# Patient Record
Sex: Female | Born: 1950 | Race: White | Hispanic: No | Marital: Married | Smoking: Never smoker
Health system: Southern US, Community
[De-identification: ages and names within clinical notes are randomized; demographics above are authoritative.]

## PROBLEM LIST (undated history)

## (undated) DIAGNOSIS — E119 Type 2 diabetes mellitus without complications: Secondary | ICD-10-CM

## (undated) DIAGNOSIS — C801 Malignant (primary) neoplasm, unspecified: Secondary | ICD-10-CM

## (undated) DIAGNOSIS — Z860101 Personal history of adenomatous and serrated colon polyps: Secondary | ICD-10-CM

## (undated) DIAGNOSIS — I10 Essential (primary) hypertension: Secondary | ICD-10-CM

## (undated) DIAGNOSIS — E785 Hyperlipidemia, unspecified: Secondary | ICD-10-CM

## (undated) DIAGNOSIS — K219 Gastro-esophageal reflux disease without esophagitis: Secondary | ICD-10-CM

## (undated) DIAGNOSIS — T7840XA Allergy, unspecified, initial encounter: Secondary | ICD-10-CM

## (undated) HISTORY — PX: SKIN CANCER EXCISION: SHX779

## (undated) HISTORY — PX: ESOPHAGOGASTRODUODENOSCOPY: SHX1529

## (undated) HISTORY — DX: Essential (primary) hypertension: I10

## (undated) HISTORY — PX: VAGINAL HYSTERECTOMY: SUR661

## (undated) HISTORY — PX: COLONOSCOPY: SHX174

## (undated) HISTORY — DX: Allergy, unspecified, initial encounter: T78.40XA

## (undated) HISTORY — PX: TUBAL LIGATION: SHX77

## (undated) HISTORY — DX: Type 2 diabetes mellitus without complications: E11.9

## (undated) HISTORY — DX: Gastro-esophageal reflux disease without esophagitis: K21.9

## (undated) HISTORY — DX: Hyperlipidemia, unspecified: E78.5

## (undated) HISTORY — DX: Malignant (primary) neoplasm, unspecified: C80.1

---

## 2006-06-19 ENCOUNTER — Ambulatory Visit (HOSPITAL_COMMUNITY): Admission: RE | Admit: 2006-06-19 | Discharge: 2006-06-20 | Payer: Self-pay | Admitting: Cardiovascular Disease

## 2007-03-29 ENCOUNTER — Ambulatory Visit: Payer: Self-pay | Admitting: Gastroenterology

## 2007-03-29 ENCOUNTER — Encounter: Payer: Self-pay | Admitting: Gastroenterology

## 2008-10-15 ENCOUNTER — Ambulatory Visit: Payer: Self-pay | Admitting: Family Medicine

## 2008-10-15 ENCOUNTER — Emergency Department: Payer: Self-pay | Admitting: Emergency Medicine

## 2008-11-17 ENCOUNTER — Ambulatory Visit: Payer: Self-pay | Admitting: Gastroenterology

## 2008-11-17 DIAGNOSIS — R109 Unspecified abdominal pain: Secondary | ICD-10-CM | POA: Insufficient documentation

## 2008-11-17 DIAGNOSIS — Z860100 Personal history of colon polyps, unspecified: Secondary | ICD-10-CM | POA: Insufficient documentation

## 2008-11-18 ENCOUNTER — Encounter: Payer: Self-pay | Admitting: Gastroenterology

## 2008-11-30 ENCOUNTER — Telehealth (INDEPENDENT_AMBULATORY_CARE_PROVIDER_SITE_OTHER): Payer: Self-pay | Admitting: *Deleted

## 2009-02-15 ENCOUNTER — Telehealth: Payer: Self-pay | Admitting: Gastroenterology

## 2009-02-16 ENCOUNTER — Ambulatory Visit: Payer: Self-pay | Admitting: Gastroenterology

## 2009-02-16 DIAGNOSIS — R933 Abnormal findings on diagnostic imaging of other parts of digestive tract: Secondary | ICD-10-CM | POA: Insufficient documentation

## 2009-02-16 LAB — CONVERTED CEMR LAB
AST: 23 units/L (ref 0–37)
Basophils Absolute: 0 10*3/uL (ref 0.0–0.1)
Bilirubin, Direct: 0.1 mg/dL (ref 0.0–0.3)
CO2: 29 meq/L (ref 19–32)
Creatinine, Ser: 0.8 mg/dL (ref 0.4–1.2)
GFR calc non Af Amer: 78.59 mL/min (ref >60)
Glucose, Bld: 189 mg/dL — ABNORMAL HIGH (ref 70–99)
HCT: 40 % (ref 36.0–46.0)
Lymphocytes Relative: 24.7 % (ref 12.0–46.0)
Monocytes Absolute: 0.3 10*3/uL (ref 0.1–1.0)
Neutro Abs: 4 10*3/uL (ref 1.4–7.7)
Potassium: 3.9 meq/L (ref 3.5–5.1)
RDW: 11.7 % (ref 11.5–14.6)
Sodium: 142 meq/L (ref 135–145)
WBC: 5.9 10*3/uL (ref 4.5–10.5)

## 2009-02-23 ENCOUNTER — Ambulatory Visit (HOSPITAL_COMMUNITY): Admission: RE | Admit: 2009-02-23 | Discharge: 2009-02-23 | Payer: Self-pay | Admitting: Gastroenterology

## 2009-03-18 ENCOUNTER — Telehealth: Payer: Self-pay | Admitting: Gastroenterology

## 2009-03-19 ENCOUNTER — Ambulatory Visit: Payer: Self-pay | Admitting: Gastroenterology

## 2009-04-05 ENCOUNTER — Ambulatory Visit: Payer: Self-pay | Admitting: Gastroenterology

## 2009-06-14 ENCOUNTER — Telehealth: Payer: Self-pay | Admitting: Gastroenterology

## 2009-07-07 ENCOUNTER — Ambulatory Visit: Payer: Self-pay | Admitting: Gastroenterology

## 2009-09-01 ENCOUNTER — Ambulatory Visit: Payer: Self-pay | Admitting: Family Medicine

## 2010-06-14 NOTE — Progress Notes (Signed)
Summary: diarrhea  Phone Note Call from Patient Call back at Home Phone [phone]   Caller: Patient Call For: Dr. Christella Hartigan Reason for Call: Talk to Nurse Summary of Call: pt reports she ia having diarrhea again and thinks she may need to go on abx Initial call taken by: Vallarie Mare,  June 14, 2009 1:02 PM  Follow-up for Phone Call        pt states she is having diarrhea  again she took immodium wich helped but it is happening more often.  She said Dr Christella Hartigan told her she may need to take flagyl again if the diarrhea returned.  SHould she do this. Follow-up by: Chales Abrahams CMA Duncan Dull),  June 14, 2009 2:16 PM  Additional Follow-up for Phone Call Additional follow up Details #1::        yes, I've suspected bacterial overgrowth in the past.  please call in flagyl 250mg , three times a day, 14 days.  have her schedule rov with me for 4-5 weeks, sooner if needed Additional Follow-up by: Rachael Fee MD,  June 14, 2009 2:25 PM    New/Updated Medications: FLAGYL 250 MG TABS (METRONIDAZOLE) 1 by mouth three times a day x 14 days Prescriptions: FLAGYL 250 MG TABS (METRONIDAZOLE) 1 by mouth three times a day x 14 days  #42 x 0   Entered by:   Chales Abrahams CMA (AAMA)   Authorized by:   Rachael Fee MD   Signed by:   Chales Abrahams CMA (AAMA) on 06/14/2009   Method used:   [facility] to        [id number]       [address]       [place], [place]  [address]       Ph: [phone] or [phone]       Fax: [phone]   RxID:   [id number]

## 2010-06-14 NOTE — Assessment & Plan Note (Signed)
Review of gastrointestinal problems: 1. Adenomatous polyps, outside provider. Last colonoscopy November 2008 found no adenomatous polyps. Recall colonoscopy November, 2013. 2. Acute abd pains, resolved (june 2010):  [place] [facility] [place], labs, then CT scan. her liver tests were completely normal, she did have a slightly elevated white blood cell count at 14,000, her lipase was normal. The ultrasound was normal except for common bile duct of 7.7 mm. Gallbladder is reported to be normal, no stones. CT Scan was essentially normal except again slightly prominent CBD.  3. intermittent diarrhea, vomiting episodes.  Possible small bowel bacterial overgrowth symptoms.  Concern for possible biliary pathology led to October, 2010 MRCP shows no CBD pathology ( nondilated, no stones) + duodenal diverticulum.  Her symptoms responded very quickly to Empiric Metronidazole for 10 days. February, 2011: symptoms again responded to Flagyl t.i.d. for 2 weeks . ??Medication related to Janumet ( #1 side effect is diarrhea)   History of Present Illness Visit Type: Follow-up Visit Primary GI MD: Rob Bunting MD Primary Provider: Gerlene Burdock Chief Complaint: follow-up pt. still c/o diarrhea has improved since taking antibiotic History of Present Illness:     very pleasant 60 year old woman whom I last saw several months ago.  who has been having intermittent loose stools for months.  A single immodium will cause constipation for days.  This was worse several weeks ago (waking her up at night, fecal incontinence).  She took 2 weeks of flagyl and and has been off for one week and has had ZERO episodes of the loose stools. She has had regular BMs for 10 days (off the flagyl).             Current Medications (verified): 1)  Zyrtec Allergy 10 Mg Tabs (Cetirizine Hcl) .Marland Kitchen.. 1 By Mouth Once Daily 2)  Cartia Xt 120 Mg Xr24h-Cap (Diltiazem Hcl Coated Beads) .Marland Kitchen.. 1 By Mouth Once Daily 3)  Aspir-Low 81 Mg Tbec  (Aspirin) .Marland Kitchen.. 1 By Mouth Once Daily 4)  Janumet 50-1000 Mg Tabs (Sitagliptin-Metformin Hcl) .Marland Kitchen.. 1 By Mouth Two Times A Day 5)  Avapro 150 Mg Tabs (Irbesartan) .Marland Kitchen.. 1 By Mouth Once Daily 6)  Pravachol 40 Mg Tabs (Pravastatin Sodium) .Marland Kitchen.. 1 By Mouth Once Daily 7)  Nexium 40 Mg Cpdr (Esomeprazole Magnesium) .Marland Kitchen.. 1 By Mouth Once Daily  Allergies (verified): 1)  ! Sudafed  Vital Signs:  Patient profile:   60 year old female Height:      69 inches Weight:      178 pounds BMI:     26.38 Pulse rate:   72 / minute Pulse rhythm:   regular BP sitting:   140 / 68  (left arm)  Vitals Entered By: Milford Cage NCMA (July 07, 2009 8:49 AM)  Physical Exam  Additional Exam:  Constitutional: generally well appearing Psychiatric: alert and oriented times 3 Abdomen: soft, non-tender, non-distended, normal bowel sounds    Impression & Recommendations:  Problem # 1:  intermittent loose stools her symptoms clearly respond to Flagyl. This suggests some type of bacterial process. She has diabetes and small bowel bacterial overgrowth is not uncommon and certainly could be playing a role. For now we will just observe how she does clinically. If she returns to having intermittent mild diarrhea she will start Imodium one half pill to one pill every day. If this is not helpful she will call and will start her Flagyl again. Janumet #1 side effect is diarrhea, her dose has not been changed in 2 years however it may  be contributing to her intermittent, chronic situation.  Patient Instructions: 1)  Prescription for flagyl given 250mg  pills, take one pill three times a day as needed. 2)  If you start to have mild, intermittent loose stools, please begin 1/2 to one full immodium pill every AM after waking up.  Only stop if no BM in 2-3 days. 3)  If this isn't helpful, begin the flagyl (above) and call Dr. Christella Hartigan office. 4)  A copy of this information will be sent to Dr. Thermon Leyland. 5)  The medication list was  reviewed and reconciled.  All changed / newly prescribed medications were explained.  A complete medication list was provided to the patient / caregiver. Prescriptions: METRONIDAZOLE 250 MG  TABS (METRONIDAZOLE) Take 1pill three times a day for 2 weeks  #42 x 2   Entered and Authorized by:   Rachael Fee MD   Signed by:   Rachael Fee MD on 07/07/2009   Method used:   [facility]        [id number]       [address]       [place], [place]  [address]       Ph: [phone] or [phone]       Fax: [phone]   RxID:   [id number]

## 2010-09-30 NOTE — Cardiovascular Report (Signed)
NAMESHANTAY, SONN             ACCOUNT NO.:  [id number]   MEDICAL RECORD NO.:  [id number]          PATIENT TYPE:  OIB   LOCATION:  2899                         FACILITY:  [facility]   PHYSICIAN:  Darlin Priestly, MD  DATE OF BIRTH:  11-Jun-1950   DATE OF PROCEDURE:  06/19/2006  DATE OF DISCHARGE:                            CARDIAC CATHETERIZATION   PROCEDURE:  A heads-up tilt-table testing with Isuprel infusion.   COMPLICATIONS:  None.   INDICATIONS:  Ms. Lewison is a 60 year old female patient of Dr.  Charlette Caffey in [place] as well as Dr. Nanetta Batty with a history of  recurrent dizziness and presyncope happening approximately once a week.  She has undergone normal Cardiolite scan ruling out ischemia as well as  a normal echocardiogram.  She is now referred for tilt-table testing to  rule out possible neurocardiogenic syncope.   DESCRIPTION:  After obtaining informed consent, the patient was brought  to the cardiac catheterization lab in a fasting state.  She then placed  in supine position where hemodynamics were obtained.  Resting blood  pressure 140/70, respiratory rate 70.  She was then monitored for  approximately 5 minutes and then tilted to a 70-degree heads-up  position.  This was maintained for 30 minutes with no significant change  in heart rate or blood pressure.  She was then returned to supine  position.  Isuprel infusion was begun at 0.5 mcg per minute.  This was  ultimately up titrated to 1 mcg per minute where she was maintained for  approximately 20 minutes.  She remained hemodynamically stable with a  maximum heart rate of 112 and no change or blood pressure or heart rate.  She was then returned back to the supine position.  She was then  transferred to the recovery room in stable condition.   CONCLUSION:  Negative heads-up tilt-table testing with Isuprel infusion.      Darlin Priestly, MD  Electronically Signed     RHM/MEDQ  D:  06/19/2006  T:   06/19/2006  Job:  [id number]   cc:   Nanetta Batty, M.D.  [employer]

## 2011-01-06 ENCOUNTER — Ambulatory Visit: Payer: Self-pay | Admitting: Family Medicine

## 2012-01-09 ENCOUNTER — Ambulatory Visit: Payer: Self-pay | Admitting: Family Medicine

## 2012-04-16 ENCOUNTER — Telehealth: Payer: Self-pay | Admitting: Gastroenterology

## 2012-04-16 NOTE — Telephone Encounter (Signed)
Pt is due for procedure 03/2012 Dr Christella Hartigan will review and I will call pt to schedule

## 2012-04-19 ENCOUNTER — Encounter: Payer: Self-pay | Admitting: Gastroenterology

## 2012-06-18 ENCOUNTER — Encounter: Payer: Self-pay | Admitting: Gastroenterology

## 2012-07-23 ENCOUNTER — Encounter: Payer: Self-pay | Admitting: Gastroenterology

## 2012-07-23 ENCOUNTER — Ambulatory Visit (AMBULATORY_SURGERY_CENTER): Payer: 59 | Admitting: *Deleted

## 2012-07-23 VITALS — Ht 69.0 in | Wt 186.6 lb

## 2012-07-23 DIAGNOSIS — Z86010 Personal history of colonic polyps: Secondary | ICD-10-CM

## 2012-07-23 DIAGNOSIS — Z1211 Encounter for screening for malignant neoplasm of colon: Secondary | ICD-10-CM

## 2012-07-23 MED ORDER — NA SULFATE-K SULFATE-MG SULF 17.5-3.13-1.6 GM/177ML PO SOLN: ORAL | Status: DC

## 2012-07-29 ENCOUNTER — Encounter: Payer: Self-pay | Admitting: Gastroenterology

## 2012-07-29 ENCOUNTER — Ambulatory Visit (AMBULATORY_SURGERY_CENTER): Payer: 59 | Admitting: Gastroenterology

## 2012-07-29 ENCOUNTER — Other Ambulatory Visit: Payer: Self-pay | Admitting: Gastroenterology

## 2012-07-29 VITALS — BP 116/67 | HR 75 | Temp 96.9°F | Resp 14 | Ht 69.0 in | Wt 186.0 lb

## 2012-07-29 DIAGNOSIS — Z86010 Personal history of colonic polyps: Secondary | ICD-10-CM

## 2012-07-29 DIAGNOSIS — Z800 Family history of malignant neoplasm of digestive organs: Secondary | ICD-10-CM

## 2012-07-29 DIAGNOSIS — Z1211 Encounter for screening for malignant neoplasm of colon: Secondary | ICD-10-CM

## 2012-07-29 DIAGNOSIS — K5730 Diverticulosis of large intestine without perforation or abscess without bleeding: Secondary | ICD-10-CM

## 2012-07-29 MED ORDER — SODIUM CHLORIDE 0.9 % IV SOLN: 500.0000 mL | INTRAVENOUS | Status: DC

## 2012-07-29 NOTE — Progress Notes (Signed)
Pressure applied to abdomen to reach cecum.  

## 2012-07-29 NOTE — Op Note (Signed)
[facility] [address] [place], [address]   COLONOSCOPY PROCEDURE REPORT  PATIENT: Angelica Murray, Angelica Murray  MR#: [id number] BIRTHDATE: 10/26/1950 , 61  yrs. old GENDER: Female ENDOSCOPIST: Rachael Fee, MD PROCEDURE DATE:  07/29/2012 PROCEDURE:   Colonoscopy, surveillance ASA CLASS:   Class II INDICATIONS:Adenomatous polyps, outside provider.  Last colonoscopy November 2008 found no adenomatous polyps.  Also several second degree relatives with colon cancer MEDICATIONS: Fentanyl 75 mcg IV, Versed 8 mg IV, and These medications were titrated to patient response per physician's verbal order  DESCRIPTION OF PROCEDURE:   After the risks benefits and alternatives of the procedure were thoroughly explained, informed consent was obtained.  A digital rectal exam revealed no abnormalities of the rectum.   The LB [id number]  endoscope was introduced through the anus and advanced to the cecum, which was identified by both the appendix and ileocecal valve. No adverse events experienced.   The quality of the prep was good.  The instrument was then slowly withdrawn as the colon was fully examined.   COLON FINDINGS: There were a few small diverticulum in left colon. The examination was otherwise normal.  Retroflexed views revealed no abnormalities. The time to cecum=3 minutes 20 seconds. Withdrawal time=6 minutes 08 seconds.  The scope was withdrawn and the procedure completed. COMPLICATIONS: There were no complications.  ENDOSCOPIC IMPRESSION: There were a few small diverticulum in left colon. The examination was otherwise normal. No polyps or cancers  RECOMMENDATIONS: Given your significant family history of colon cancer (several second degree relatives) and personal history of adenomatous polyps you should have a repeat colonoscopy in 5 years   eSigned:  Rachael Fee, MD 07/29/2012 11:02 AM   cc: Dennison Mascot, MD

## 2012-07-29 NOTE — Patient Instructions (Addendum)
YOU HAD AN ENDOSCOPIC PROCEDURE TODAY AT THE [facility]: Refer to the procedure report that was given to you for any specific questions about what was found during the examination.  If the procedure report does not answer your questions, please call your gastroenterologist to clarify.  If you requested that your care partner not be given the details of your procedure findings, then the procedure report has been included in a sealed envelope for you to review at your convenience later.  YOU SHOULD EXPECT: Some feelings of bloating in the abdomen. Passage of more gas than usual.  Walking can help get rid of the air that was put into your GI tract during the procedure and reduce the bloating. If you had a lower endoscopy (such as a colonoscopy or flexible sigmoidoscopy) you may notice spotting of blood in your stool or on the toilet paper. If you underwent a bowel prep for your procedure, then you may not have a normal bowel movement for a few days.  DIET: Your first meal following the procedure should be a light meal and then it is ok to progress to your normal diet.  A half-sandwich or bowl of soup is an example of a good first meal.  Heavy or fried foods are harder to digest and may make you feel nauseous or bloated.  Likewise meals heavy in dairy and vegetables can cause extra gas to form and this can also increase the bloating.  Drink plenty of fluids but you should avoid alcoholic beverages for 24 hours.  ACTIVITY: Your care partner should take you home directly after the procedure.  You should plan to take it easy, moving slowly for the rest of the day.  You can resume normal activity the day after the procedure however you should NOT DRIVE or use heavy machinery for 24 hours (because of the sedation medicines used during the test).    SYMPTOMS TO REPORT IMMEDIATELY: A gastroenterologist can be reached at any hour.  During normal business hours, 8:30 AM to 5:00 PM Monday through Friday,  call [phone].  After hours and on weekends, please call the GI answering service at [phone] who will take a message and have the physician on call contact you.   Following lower endoscopy (colonoscopy or flexible sigmoidoscopy):  Excessive amounts of blood in the stool  Significant tenderness or worsening of abdominal pains  Swelling of the abdomen that is new, acute  Fever of 100F or higher  Following upper endoscopy (EGD)  Vomiting of blood or coffee ground material  New chest pain or pain under the shoulder blades  Painful or persistently difficult swallowing  New shortness of breath  Fever of 100F or higher  Black, tarry-looking stools  FOLLOW UP: If any biopsies were taken you will be contacted by phone or by letter within the next 1-3 weeks.  Call your gastroenterologist if you have not heard about the biopsies in 3 weeks.  Our staff will call the home number listed on your records the next business day following your procedure to check on you and address any questions or concerns that you may have at that time regarding the information given to you following your procedure. This is a courtesy call and so if there is no answer at the home number and we have not heard from you through the emergency physician on call, we will assume that you have returned to your regular daily activities without incident.  SIGNATURES/CONFIDENTIALITY: You and/or your care   partner have signed paperwork which will be entered into your electronic medical record.  These signatures attest to the fact that that the information above on your After Visit Summary has been reviewed and is understood.  Full responsibility of the confidentiality of this discharge information lies with you and/or your care-partner.  

## 2012-07-29 NOTE — Progress Notes (Signed)
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  This procedure was done 07-16-12.  Pt denies any pain. Maw

## 2012-07-29 NOTE — Progress Notes (Signed)
Patient did not experience any of the following events: a burn prior to discharge; a fall within the facility; wrong site/side/patient/procedure/implant event; or a hospital transfer or hospital admission upon discharge from the facility. (G8907) Patient did not have preoperative order for IV antibiotic SSI prophylaxis. (G8918)  

## 2012-07-30 ENCOUNTER — Telehealth: Payer: Self-pay | Admitting: *Deleted

## 2012-07-30 NOTE — Telephone Encounter (Signed)
  Follow up Call-  Call back number 07/29/2012  Post procedure Call Back phone  # [phone] hm  Permission to leave phone message Yes     Patient questions:  Do you have a fever, pain , or abdominal swelling? no Pain Score  0 *  Have you tolerated food without any problems? yes  Have you been able to return to your normal activities? yes  Do you have any questions about your discharge instructions: Diet   no Medications  no Follow up visit  no  Do you have questions or concerns about your Care? no  Actions: * If pain score is 4 or above: No action needed, pain <4.  Pt. Stated I still the effects of sedation, still fill sleepy. Instructed pt. Just take it easy and slow today that sedation hang around in system a little longer she verbalize understanding.

## 2012-12-30 ENCOUNTER — Telehealth: Payer: Self-pay | Admitting: Gastroenterology

## 2012-12-30 NOTE — Telephone Encounter (Signed)
Pt complains of severe diarrhea "feels like it did when I had a bacterial overgrowth"  Added to Doug Sou appt for 01/01/13

## 2013-01-01 ENCOUNTER — Encounter: Payer: Self-pay | Admitting: Gastroenterology

## 2013-01-01 ENCOUNTER — Ambulatory Visit (INDEPENDENT_AMBULATORY_CARE_PROVIDER_SITE_OTHER): Payer: 59 | Admitting: Gastroenterology

## 2013-01-01 VITALS — BP 118/61 | HR 74 | Ht 69.0 in | Wt 182.2 lb

## 2013-01-01 DIAGNOSIS — R197 Diarrhea, unspecified: Secondary | ICD-10-CM | POA: Insufficient documentation

## 2013-01-01 MED ORDER — METRONIDAZOLE 250 MG PO TABS: 250.0000 mg | ORAL_TABLET | Freq: Three times a day (TID) | ORAL | Status: AC

## 2013-01-01 NOTE — Patient Instructions (Addendum)
We have given you lab orders to take to [facility]. We sent a prescription for Flagyl ( Metronidazole ) to [facility] [place] [place]. We made you an appointment to follow up with Dr. Christella Hartigan on 02-05-2013 at 2:15 PM.

## 2013-01-01 NOTE — Progress Notes (Signed)
01/01/2013 Angelica Murray [id number] 1951-01-22   History of Present Illness:  This is a 62 year old female who is known to Dr. Christella Hartigan.  She has seen him in the past for complaints of intermittent diarrhea with urgency and incontinence.  Has been evaluated with U/S, CT scan, MRI/MRCP, colonoscopy, and stool studies during episodes of this diarrhea, which were all unrevealing.  She has tried probiotics in the past, but they did not seem to help.  She did respond to a course of flagyl, however.    She presents to our office today with the same symptoms of diarrhea 4-6 times a day.  It is very watery and has a lot of urgency with it.  She has been wearing Depends because she has episodes of incontinence.  Does not seem to be worse with certain foods.  She is on Janumet and has been on that for several years with no recent change in dose.  No recent antibiotic use or travel.  Colonoscopy 07/2012 was normal except for a few small diverticulum in the left colon.   Current Medications, Allergies, Past Medical History, Past Surgical History, Family History and Social History were reviewed in [employer] record.   Physical Exam: BP 118/61  Pulse 74  Ht 5\' 9"  (1.753 m)  Wt 182 lb 3.2 oz (82.645 kg)  BMI 26.89 kg/m2  SpO2 98% General: Well developed, white female in no acute distress Head: Normocephalic and atraumatic Eyes:  sclerae anicteric, conjunctiva pink  Ears: Normal auditory acuity Lungs: Clear throughout to auscultation Heart: Regular rate and rhythm Abdomen: Soft, non-tender and non-distended. No masses, no hepatomegaly.  Slightly hyperactive bowel sounds. Musculoskeletal: Symmetrical with no gross deformities  Extremities: No edema  Neurological: Alert oriented x 4, grossly nonfocal Psychological:  Alert and cooperative. Normal mood and affect  Assessment and Recommendations: -Diarrhea:  Similar to previous episodes but now with much more urgency and  incontinence at times.  Responded previously to a course of flagyl.  ? SIBO vs IBS.  Also, she is on Janumet, which can definitely cause diarrhea and may be contributing to the situation.  Will check TSH and celiac labs.  Repeat a course of flagyl, 250 mg TID x 14 days.  Follow up in 4-6 weeks.

## 2013-01-01 NOTE — Progress Notes (Signed)
i agree with this plan 

## 2013-02-05 ENCOUNTER — Encounter: Payer: Self-pay | Admitting: Gastroenterology

## 2013-02-05 ENCOUNTER — Ambulatory Visit (INDEPENDENT_AMBULATORY_CARE_PROVIDER_SITE_OTHER): Payer: 59 | Admitting: Gastroenterology

## 2013-02-05 VITALS — BP 130/78 | HR 60 | Ht 69.0 in | Wt 180.0 lb

## 2013-02-05 DIAGNOSIS — R1013 Epigastric pain: Secondary | ICD-10-CM

## 2013-02-05 DIAGNOSIS — R112 Nausea with vomiting, unspecified: Secondary | ICD-10-CM

## 2013-02-05 DIAGNOSIS — G8929 Other chronic pain: Secondary | ICD-10-CM

## 2013-02-05 MED ORDER — CHOLESTYRAMINE 4 GM/DOSE PO POWD: 4.0000 g | Freq: Every day | ORAL | Status: DC

## 2013-02-05 NOTE — Patient Instructions (Addendum)
You will be set up for a CT scan of abdomen and pelvis with IV and oral contrast for epigastric pain, vomiting.  You have been scheduled for a CT scan of the abdomen and pelvis at [facility] ([address]---this is in the same building as [facility]).   You are scheduled on 02/11/13 at 1 pm. You should arrive 15 minutes prior to your appointment time for registration. Please follow the written instructions below on the day of your exam:  WARNING: IF YOU ARE ALLERGIC TO IODINE/X-RAY DYE, PLEASE NOTIFY RADIOLOGY IMMEDIATELY AT [phone]! YOU WILL BE GIVEN A 13 HOUR PREMEDICATION PREP.  1) Do not eat or drink anything after 9 am (4 hours prior to your test) 2) You have been given 2 bottles of oral contrast to drink. The solution may taste better if refrigerated, but do NOT add ice or any other liquid to this solution. Shake well before drinking.    Drink 1 bottle of contrast @ 11 am (2 hours prior to your exam)  Drink 1 bottle of contrast @ 12 noon (1 hour prior to your exam)  You may take any medications as prescribed with a small amount of water except for the following: Metformin, Glucophage, Glucovance, Avandamet, Riomet, Fortamet, Actoplus Met, Janumet, Glumetza or Metaglip. The above medications must be held the day of the exam AND 48 hours after the exam.  The purpose of you drinking the oral contrast is to aid in the visualization of your intestinal tract. The contrast solution may cause some diarrhea. Before your exam is started, you will be given a small amount of fluid to drink. Depending on your individual set of symptoms, you may also receive an intravenous injection of x-ray contrast/dye. Plan on being at [facility] for 30 minutes or long, depending on the type of exam you are having performed.  This test typically takes 30-45 minutes to complete.  If you have any questions regarding your exam or if you need to reschedule, you may call the CT department  at [phone] between the hours of 8:00 am and 5:00 pm, Monday-Friday.  ________________________________________________________________________  Send a copy of your cmet to [place] when it is available.. Please start once daily cholestryamine 4gm powder, once daily.

## 2013-02-05 NOTE — Progress Notes (Signed)
Review of gastrointestinal problems:  1. Adenomatous polyps, outside provider. Last colonoscopy November 2008 found no adenomatous polyps. Repeat colonoscopy 07/2012 found diverticulosis, no polyps.  REcall at 5 years (many second degree relatives with CRC) 2. Acute abd pains, resolved (june 2010): [facility] [place], labs, then CT scan. her liver tests were completely normal, she did have a slightly elevated white blood cell count at 14,000, her lipase was normal. The ultrasound was normal except for common bile duct of 7.7 mm. Gallbladder is reported to be normal, no stones. CT Scan was essentially normal except again slightly prominent CBD.  3. intermittent diarrhea, vomiting episodes. Possible small bowel bacterial overgrowth symptoms. Concern for possible biliary pathology led to October, 2010 MRCP shows no CBD pathology ( nondilated, no stones) + duodenal diverticulum. Her symptoms responded very quickly to Empiric Metronidazole for 10 days. February, 2011: symptoms again responded to Flagyl t.i.d. for 2 weeks . ??Medication related to Janumet ( #1 side effect is diarrhea). 12/2012 another episode, given repeat course of flagyl for 2 weeks, ordered labs but she didn't get them done.   HPI: This is a   very pleasant 62 year old woman whom I last saw personally to 3 years ago. She was here in the office a month ago with diarrhea. This is felt to be perhaps her same diarrhea episodes that she had in the past but improve with him. Metronidazole. Presumed possible small bowel bacterial overgrowth. She had celiac panel testing and thyroid testing done at [facility] she tells me that those were all normal. Her diarrhea has improved a bit but she still having several loose stools a day. She is also very bothered by ileus nausea and at times bilious emesis. She has some epigastric abdominal pain.  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  Diarrhea is  bit better since the flagyl.  She will go 6 times per day. Usually loose, can be formed at times though.  No fevers or chills.  She has been vomiting a lot, taste like bile to her.  Not daily.     Past Medical History  Diagnosis Date  . Allergy   . Diabetes mellitus without complication   . GERD (gastroesophageal reflux disease)   . Hyperlipidemia   . Hypertension   . Cancer     skin CA- squamous cell- leg; basal cell- face    Past Surgical History  Procedure Laterality Date  . Skin cancer excision    . Colonoscopy    . Vaginal hysterectomy    . Tubal ligation      Current Outpatient Prescriptions  Medication Sig Dispense Refill  . aspirin 81 MG tablet Take 81 mg by mouth daily.      Marland Kitchen diltiazem (CARDIZEM CD) 120 MG 24 hr capsule Take 120 mg by mouth daily.      Marland Kitchen esomeprazole (NEXIUM) 40 MG capsule Take 40 mg by mouth daily before breakfast.      . irbesartan (AVAPRO) 150 MG tablet Take 150 mg by mouth at bedtime.      Marland Kitchen losartan (COZAAR) 100 MG tablet       . pravastatin (PRAVACHOL) 40 MG tablet Take 40 mg by mouth daily.      . sitaGLIPtan-metformin (JANUMET) 50-1000 MG per tablet Take 1 tablet by mouth 2 (two) times daily with a meal.       No current facility-administered medications for this visit.    Allergies as of  02/05/2013 - Review Complete 02/05/2013  Allergen Reaction Noted  . Morphine and related Nausea And Vomiting 07/23/2012  . Pseudoephedrine Hives 11/09/2008    Family History  Problem Relation Age of Onset  . Colon polyps Sister   . Colon cancer Maternal Uncle     6 maternal uncles had colon/rectal CA  . Rectal cancer Maternal Uncle   . Colon cancer Maternal Uncle   . Colon cancer Maternal Uncle   . Colon cancer Maternal Uncle   . Colon cancer Maternal Uncle   . Colon cancer Maternal Uncle   . Colon polyps Sister   . Prostate cancer Father   . Esophageal cancer Neg Hx   . Stomach cancer Neg Hx     History   Social History  . Marital  Status: Married    Spouse Name: N/A    Number of Children: N/A  . Years of Education: N/A   Occupational History  . Not on file.   Social History Main Topics  . Smoking status: Never Smoker   . Smokeless tobacco: Never Used  . Alcohol Use: No  . Drug Use: No  . Sexual Activity: Not on file   Other Topics Concern  . Not on file   Social History Narrative  . No narrative on file      Physical Exam: BP 130/78  Pulse 60  Ht 5\' 9"  (1.753 m)  Wt 180 lb (81.647 kg)  BMI 26.57 kg/m2 Constitutional: generally well-appearing Psychiatric: alert and oriented x3 Abdomen: soft, slightly tender in mid epigastrium, nondistended, no obvious ascites, no peritoneal signs, normal bowel sounds     Assessment and plan: 62 y.o. female with recurrent episodes of several days, weeks of diarrhea.    this has been worked up in the past see above. she is a bit tender on examination in her epigastrium she has had no fevers or chills. I'm going to try her on cholestyramine powder to see if that will help her loose stools.  I am also going to have her get a CT scan abdomen and pelvis for epigastric pain.

## 2013-02-10 ENCOUNTER — Other Ambulatory Visit: Payer: Self-pay

## 2013-02-10 DIAGNOSIS — Z1389 Encounter for screening for other disorder: Secondary | ICD-10-CM

## 2013-02-11 ENCOUNTER — Ambulatory Visit (INDEPENDENT_AMBULATORY_CARE_PROVIDER_SITE_OTHER)
Admission: RE | Admit: 2013-02-11 | Discharge: 2013-02-11 | Disposition: A | Discharge status: Home / Self Care | Payer: 59 | Source: Ambulatory Visit | Attending: Gastroenterology | Admitting: Gastroenterology

## 2013-02-11 DIAGNOSIS — R112 Nausea with vomiting, unspecified: Secondary | ICD-10-CM

## 2013-02-11 DIAGNOSIS — G8929 Other chronic pain: Secondary | ICD-10-CM

## 2013-02-11 DIAGNOSIS — R1013 Epigastric pain: Secondary | ICD-10-CM

## 2013-02-11 MED ORDER — IOHEXOL 300 MG/ML  SOLN
100.0000 mL | Freq: Once | INTRAMUSCULAR | Status: AC | PRN
  Administered 2013-02-11: 100 mL via INTRAVENOUS

## 2013-02-12 ENCOUNTER — Telehealth: Payer: Self-pay | Admitting: Gastroenterology

## 2013-02-12 NOTE — Telephone Encounter (Signed)
Please call the patient. CT scan shows no clear cause of her symptoms, notable most for duodenal diverticulum (4cm). Should continue with the suggestions outlined at recent visit. She should call here in 4 weeks to report on her symptoms.

## 2013-02-12 NOTE — Telephone Encounter (Signed)
The patient has been notified of this information and all questions answered.

## 2013-10-10 ENCOUNTER — Ambulatory Visit: Payer: Self-pay | Admitting: Family Medicine

## 2014-07-03 DIAGNOSIS — I10 Essential (primary) hypertension: Secondary | ICD-10-CM

## 2014-07-03 DIAGNOSIS — E1129 Type 2 diabetes mellitus with other diabetic kidney complication: Secondary | ICD-10-CM | POA: Insufficient documentation

## 2014-07-03 DIAGNOSIS — E7800 Pure hypercholesterolemia, unspecified: Secondary | ICD-10-CM

## 2014-07-03 HISTORY — DX: Pure hypercholesterolemia, unspecified: E78.00

## 2014-07-03 HISTORY — DX: Type 2 diabetes mellitus with other diabetic kidney complication: E11.29

## 2014-07-03 HISTORY — DX: Essential (primary) hypertension: I10

## 2015-05-03 ENCOUNTER — Other Ambulatory Visit: Payer: Self-pay | Admitting: Internal Medicine

## 2015-05-03 DIAGNOSIS — Z1231 Encounter for screening mammogram for malignant neoplasm of breast: Secondary | ICD-10-CM

## 2015-05-12 ENCOUNTER — Ambulatory Visit: Payer: Self-pay

## 2015-05-22 IMAGING — CT CT ABD-PELV W/ CM
2 of 5 series · 16 of 46 positions shown, 18 images · IV contrast (Omnipaque 300)
Comparison: abdominal MR February 23, 2009

CLINICAL DATA: Epigastric pain with nausea, bilious vomiting, and
diarrhea

EXAM:
CT ABDOMEN AND PELVIS WITH CONTRAST
TECHNIQUE: Multidetector CT imaging of the abdomen and pelvis was performed
using the standard protocol following bolus administration of
intravenous contrast. Oral contrast was also administered.
CONTRAST:  100mL OMNIPAQUE IOHEXOL 300 MG/ML  SOLN

[Series 2: abd/ pel 5mm · axial · 0.69mm/px · z∈[-442,-17]mm · 13 of 95 slices shown, 15 images]
[im 5/95  soft-tissue]
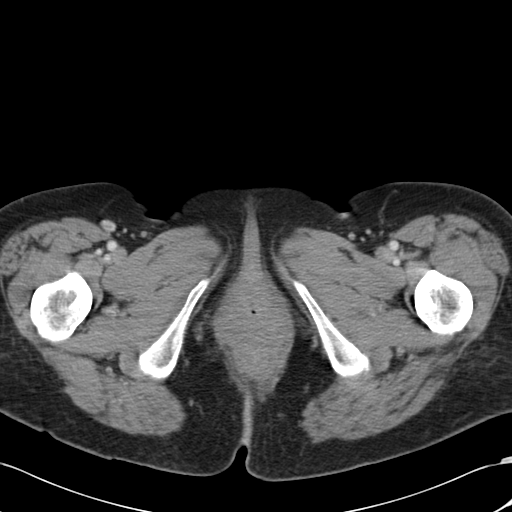
[im 5/95  bone]
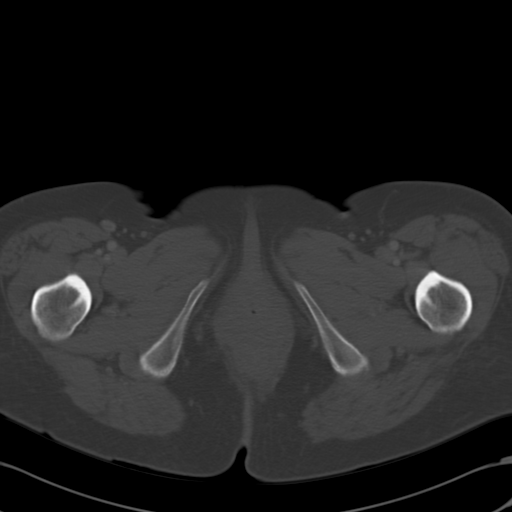
[im 15/95  soft-tissue]
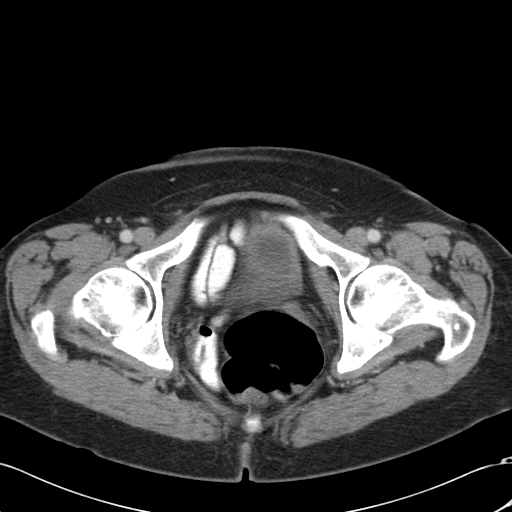
[im 19/95  soft-tissue]
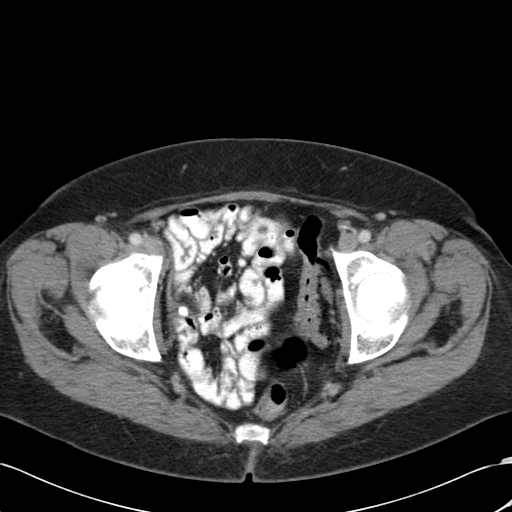
[im 29/95  soft-tissue]
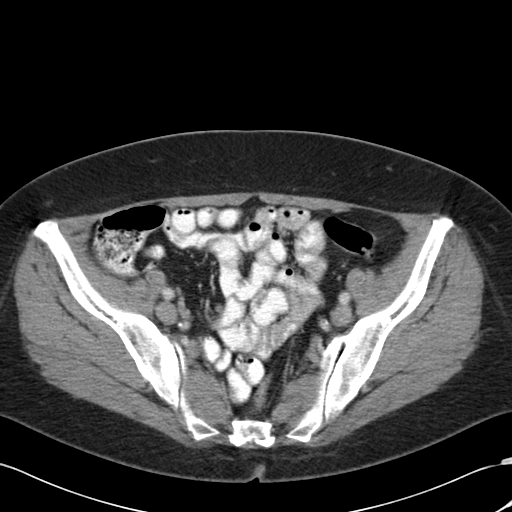
[im 33/95  soft-tissue]
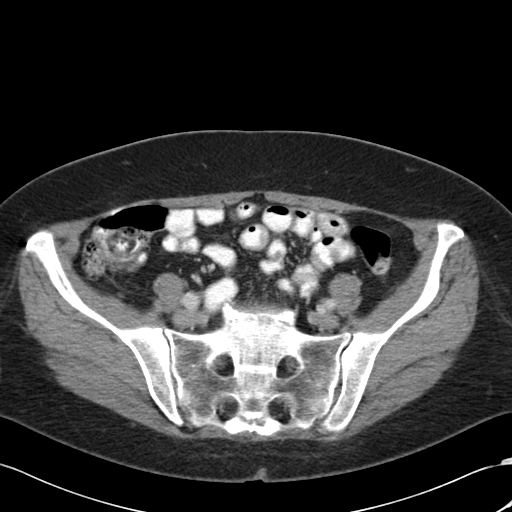
[im 43/95  soft-tissue]
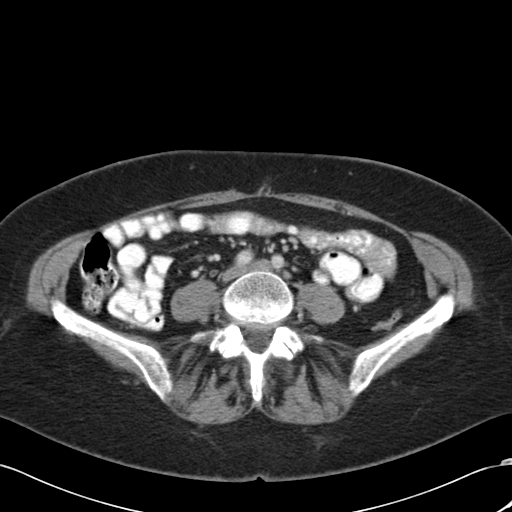
[im 48/95  soft-tissue]
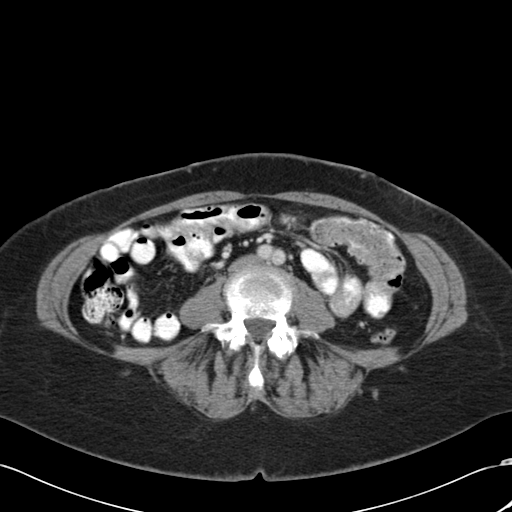
[im 52/95  soft-tissue]
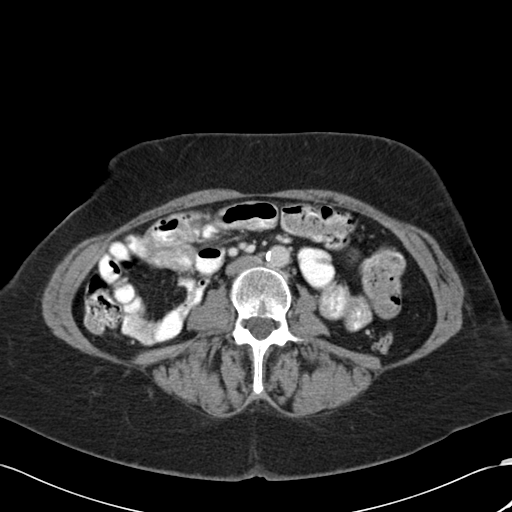
[im 62/95  soft-tissue]
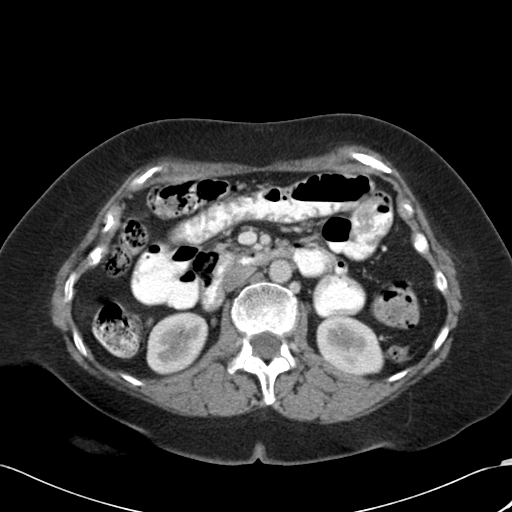
[im 62/95  bone]
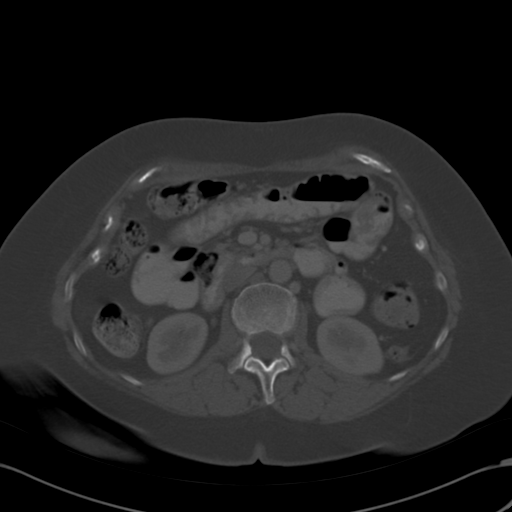
[im 66/95  soft-tissue]
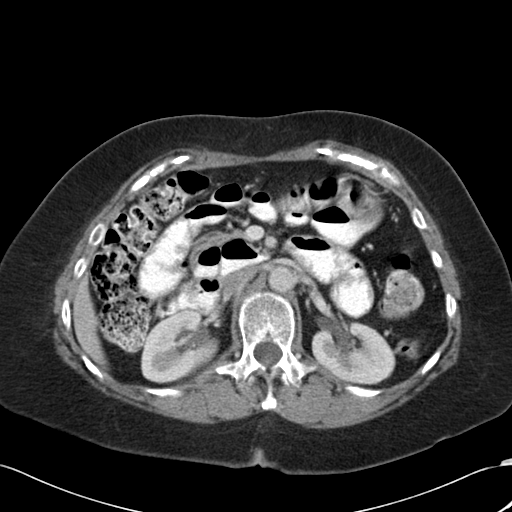
[im 76/95  soft-tissue]
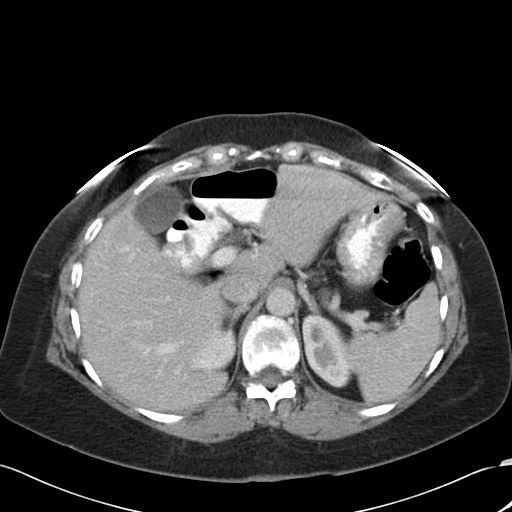
[im 80/95  soft-tissue]
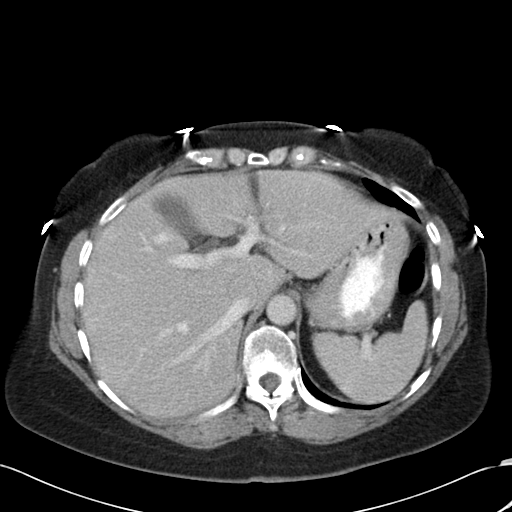
[im 90/95  soft-tissue]
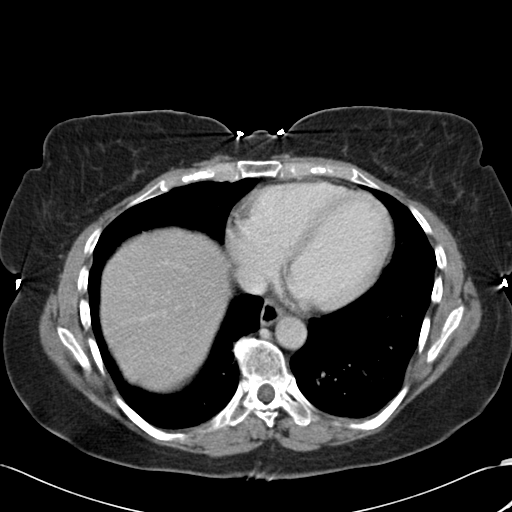

[Series 602: cor · coronal · 0.95mm/px · 3 of 101 slices shown]
[im 34/101  soft-tissue]
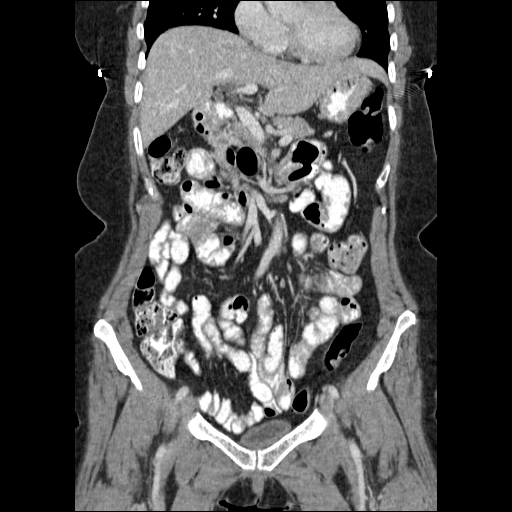
[im 45/101  soft-tissue]
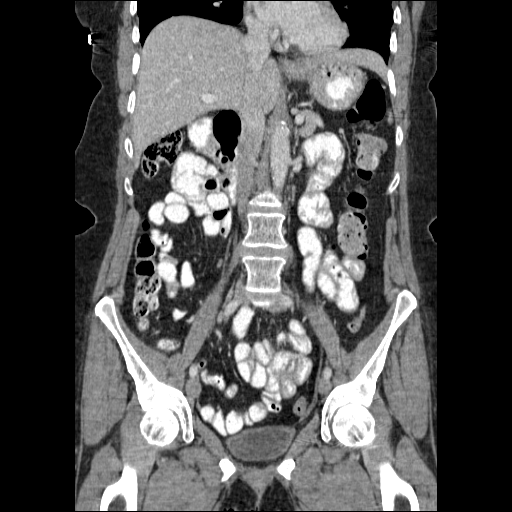
[im 56/101  soft-tissue]
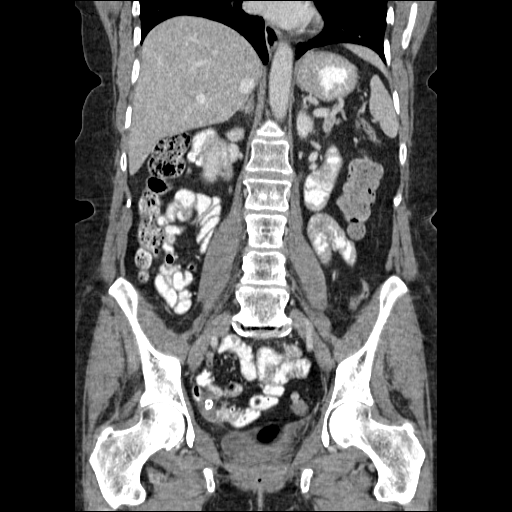

[16 of 46 positions shown; findings below may reference images not displayed]

FINDINGS: The lung bases are clear. There is a small hiatal hernia.

No focal liver lesions are identified. There are a few small
echogenic foci in the gallbladder, suspicious for small calculi.
Gallbladder wall is not thickened. There is no biliary duct
dilatation.

In the left upper quadrant, there is a duodenal diverticulum
measuring 4.6 x 4.1 cm. Spleen, pancreas, and adrenals appear
normal. It is noted the distal common bile duct courses more
horizontally than is generally seen, a finding that is stable and
was noted on the prior abdominal MRI study.

Kidneys bilaterally show no mass or hydronephrosis on either side.

In the pelvis, there are sigmoid diverticula without diverticulitis.
There is no pelvic mass or fluid collection. The appendix appears
normal.

There is no bowel obstruction. No free air or portal venous air.
There is no ascites, adenopathy, or abscess in the abdomen or
pelvis. Rectum is distended with air currently.

The aorta shows atherosclerotic change but is non aneurysmal. There
is degenerative change in the lumbar spine. There are no blastic or
lytic bone lesions.
IMPRESSION: There are a few echogenic foci in the gallbladder which may
represent small gallstones. Gallbladder wall does not appear
thickened.

Duodenal diverticulum.

The rectum is distended with air. There are sigmoid diverticula
without diverticulitis.

No bowel obstruction. No abscess or mesenteric inflammation.
Appendix appears normal.

Small hiatal hernia.

## 2016-01-04 DIAGNOSIS — Z0000 Encounter for general adult medical examination without abnormal findings: Secondary | ICD-10-CM | POA: Insufficient documentation

## 2019-06-20 ENCOUNTER — Ambulatory Visit: Payer: Self-pay

## 2019-06-21 ENCOUNTER — Ambulatory Visit: Payer: Medicare Other | Attending: Internal Medicine

## 2019-06-21 DIAGNOSIS — Z23 Encounter for immunization: Secondary | ICD-10-CM | POA: Insufficient documentation

## 2019-06-21 NOTE — Progress Notes (Signed)
   Covid-19 Vaccination Clinic  Name:  Angelica Murray    MRN: [id number] DOB: 21-Dec-1950  06/21/2019  [name] was observed post Covid-19 immunization for 15 minutes without incidence. She was provided with Vaccine Information Sheet and instruction to access the V-Safe system.   [name] was instructed to call 911 with any severe reactions post vaccine: Marland Kitchen Difficulty breathing  . Swelling of your face and throat  . A fast heartbeat  . A bad rash all over your body  . Dizziness and weakness    Immunizations Administered    Name Date Dose VIS Date Route   Pfizer COVID-19 Vaccine 06/21/2019  8:31 AM 0.3 mL 04/25/2019 Intramuscular   Manufacturer: [place]   Lot: [id number]   [place]: [id number]

## 2019-07-15 ENCOUNTER — Ambulatory Visit: Payer: Medicare Other | Attending: Internal Medicine

## 2019-07-15 ENCOUNTER — Ambulatory Visit: Payer: Medicare Other

## 2019-07-15 DIAGNOSIS — Z23 Encounter for immunization: Secondary | ICD-10-CM | POA: Insufficient documentation

## 2019-07-15 NOTE — Progress Notes (Signed)
   Covid-19 Vaccination Clinic  Name:  Angelica Murray    MRN: [id number] DOB: July 18, 1950  07/15/2019  Angelica Murray was observed post Covid-19 immunization for 15 minutes without incident. She was provided with Vaccine Information Sheet and instruction to access the V-Safe system.   Angelica Murray was instructed to call 911 with any severe reactions post vaccine: Marland Kitchen Difficulty breathing  . Swelling of face and throat  . A fast heartbeat  . A bad rash all over body  . Dizziness and weakness   Immunizations Administered    Name Date Dose VIS Date Route   Pfizer COVID-19 Vaccine 07/15/2019 11:59 AM 0.3 mL 04/25/2019 Intramuscular   Manufacturer: [place]   Lot: [id number]   [place]: [id number]

## 2019-12-02 ENCOUNTER — Telehealth: Payer: Self-pay | Admitting: Obstetrics & Gynecology

## 2019-12-02 NOTE — Telephone Encounter (Signed)
[facility] referring for Vaginal Mass. Paper records. Called and left voicemail for patient to call back to be schedule

## 2019-12-08 ENCOUNTER — Other Ambulatory Visit (HOSPITAL_COMMUNITY)
Admission: RE | Admit: 2019-12-08 | Discharge: 2019-12-08 | Disposition: A | Discharge status: Home / Self Care | Payer: Medicare Other | Source: Ambulatory Visit | Attending: Obstetrics & Gynecology | Admitting: Obstetrics & Gynecology

## 2019-12-08 DIAGNOSIS — Z1272 Encounter for screening for malignant neoplasm of vagina: Secondary | ICD-10-CM | POA: Insufficient documentation

## 2019-12-09 ENCOUNTER — Encounter: Payer: Self-pay | Admitting: Obstetrics & Gynecology

## 2019-12-09 ENCOUNTER — Other Ambulatory Visit: Payer: Self-pay

## 2019-12-09 ENCOUNTER — Ambulatory Visit (INDEPENDENT_AMBULATORY_CARE_PROVIDER_SITE_OTHER): Payer: Medicare Other | Admitting: Obstetrics & Gynecology

## 2019-12-09 VITALS — BP 140/80 | Ht 69.0 in | Wt 185.0 lb

## 2019-12-09 DIAGNOSIS — Z1272 Encounter for screening for malignant neoplasm of vagina: Secondary | ICD-10-CM | POA: Diagnosis present

## 2019-12-09 DIAGNOSIS — N3281 Overactive bladder: Secondary | ICD-10-CM | POA: Diagnosis not present

## 2019-12-09 MED ORDER — OXYBUTYNIN CHLORIDE ER 10 MG PO TB24: 10.0000 mg | ORAL_TABLET | Freq: Every day | ORAL | 11 refills | Status: DC

## 2019-12-09 NOTE — Patient Instructions (Signed)
Thank you for choosing [facility]. As part of our ongoing efforts to improve patient experience, we would appreciate your feedback. Please fill out the short survey that you will receive by mail or MyChart. Your opinion is important to [place]! -Dr Kenton Kingfisher  Overactive Bladder, Adult  Overactive bladder refers to a condition in which a person has a sudden need to pass urine. The person may leak urine if he or she cannot get to the bathroom fast enough (urinary incontinence). A person with this condition may also wake up several times in the night to go to the bathroom. Overactive bladder is associated with poor nerve signals between your bladder and your brain. Your bladder may get the signal to empty before it is full. You may also have very sensitive muscles that make your bladder squeeze too soon. These symptoms might interfere with daily work or social activities. What are the causes? This condition may be associated with or caused by:  Urinary tract infection.  Infection of nearby tissues, such as the prostate.  Prostate enlargement.  Surgery on the uterus or urethra.  Bladder stones, inflammation, or tumors.  Drinking too much caffeine or alcohol.  Certain medicines, especially medicines that get rid of extra fluid in the body (diuretics).  Muscle or nerve weakness, especially from: ? A spinal cord injury. ? Stroke. ? Multiple sclerosis. ? Parkinson's disease.  Diabetes.  Constipation. What increases the risk? You may be at greater risk for overactive bladder if you:  Are an older adult.  Smoke.  Are going through menopause.  Have prostate problems.  Have a neurological disease, such as stroke, dementia, Parkinson's disease, or multiple sclerosis (MS).  Eat or drink things that irritate the bladder. These include alcohol, spicy food, and caffeine.  Are overweight or obese. What are the signs or symptoms? Symptoms of this condition include:  Sudden, strong urge  to urinate.  Leaking urine.  Urinating 8 or more times a day.  Waking up to urinate 2 or more times a night. How is this diagnosed? Your health care provider may suspect overactive bladder based on your symptoms. He or she will diagnose this condition by:  A physical exam and medical history.  Blood or urine tests. You might need bladder or urine tests to help determine what is causing your overactive bladder. You might also need to see a health care provider who specializes in urinary tract problems (urologist). How is this treated? Treatment for overactive bladder depends on the cause of your condition and whether it is mild or severe. You can also make lifestyle changes at home. Options include:  Bladder training. This may include: ? Learning to control the urge to urinate by following a schedule that directs you to urinate at regular intervals (timed voiding). ? Doing Kegel exercises to strengthen your pelvic floor muscles, which support your bladder. Toning these muscles can help you control urination, even if your bladder muscles are overactive.  Special devices. This may include: ? Biofeedback, which uses sensors to help you become aware of your body's signals. ? Electrical stimulation, which uses electrodes placed inside the body (implanted) or outside the body. These electrodes send gentle pulses of electricity to strengthen the nerves or muscles that control the bladder. ? Women may use a plastic device that fits into the vagina and supports the bladder (pessary).  Medicines. ? Antibiotics to treat bladder infection. ? Antispasmodics to stop the bladder from releasing urine at the wrong time. ? Tricyclic antidepressants to relax  bladder muscles. ? Injections of botulinum toxin type A directly into the bladder tissue to relax bladder muscles.  Lifestyle changes. This may include: ? Weight loss. Talk to your health care provider about weight loss methods that would work best  for you. ? Diet changes. This may include reducing how much alcohol and caffeine you consume, or drinking fluids at different times of the day. ? Not smoking. Do not use any products that contain nicotine or tobacco, such as cigarettes and e-cigarettes. If you need help quitting, ask your health care provider.  Surgery. ? A device may be implanted to help manage the nerve signals that control urination. ? An electrode may be implanted to stimulate electrical signals in the bladder. ? A procedure may be done to change the shape of the bladder. This is done only in very severe cases. Follow these instructions at home: Lifestyle  Make any diet or lifestyle changes that are recommended by your health care provider. These may include: ? Drinking less fluid or drinking fluids at different times of the day. ? Cutting down on caffeine or alcohol. ? Doing Kegel exercises. ? Losing weight if needed. ? Eating a healthy and balanced diet to prevent constipation. This may include:  Eating foods that are high in fiber, such as fresh fruits and vegetables, whole grains, and beans.  Limiting foods that are high in fat and processed sugars, such as fried and sweet foods. General instructions  Take over-the-counter and prescription medicines only as told by your health care provider.  If you were prescribed an antibiotic medicine, take it as told by your health care provider. Do not stop taking the antibiotic even if you start to feel better.  Use any implants or pessary as told by your health care provider.  If needed, wear pads to absorb urine leakage.  Keep a journal or log to track how much and when you drink and when you feel the need to urinate. This will help your health care provider monitor your condition.  Keep all follow-up visits as told by your health care provider. This is important. Contact a health care provider if:  You have a fever.  Your symptoms do not get better with  treatment.  Your pain and discomfort get worse.  You have more frequent urges to urinate. Get help right away if:  You are not able to control your bladder. Summary  Overactive bladder refers to a condition in which a person has a sudden need to pass urine.  Several conditions may lead to an overactive bladder.  Treatment for overactive bladder depends on the cause and severity of your condition.  Follow your health care provider's instructions about lifestyle changes, doing Kegel exercises, keeping a journal, and taking medicines. This information is not intended to replace advice given to you by your health care provider. Make sure you discuss any questions you have with your health care provider. Document Revised: 08/22/2018 Document Reviewed: 05/17/2017 Elsevier Patient Education  [place].   Oxybutynin extended-release tablets What is this medicine? OXYBUTYNIN (ox i BYOO ti nin) is used to treat overactive bladder. This medicine reduces the amount of bathroom visits. It may also help to control wetting accidents. This medicine may be used for other purposes; ask your health care provider or pharmacist if you have questions. COMMON BRAND NAME(S): Ditropan XL What should I tell my health care provider before I take this medicine? They need to know if you have any of these conditions:  autonomic neuropathy  dementia  difficulty passing urine  glaucoma  intestinal obstruction  kidney disease  liver disease  myasthenia gravis  Parkinson's disease  an unusual or allergic reaction to oxybutynin, other medicines, foods, dyes, or preservatives  pregnant or trying to get pregnant  breast-feeding How should I use this medicine? Take this medicine by mouth with a glass of water. Swallow whole, do not crush, cut, or chew. Follow the directions on the prescription label. You can take this medicine with or without food. Take your doses at regular intervals. Do  not take your medicine more often than directed. Talk to your pediatrician regarding the use of this medicine in children. Special care may be needed. While this drug may be prescribed for children as young as 6 years for selected conditions, precautions do apply. Overdosage: If you think you have taken too much of this medicine contact a poison control center or emergency room at once. NOTE: This medicine is only for you. Do not share this medicine with others. What if I miss a dose? If you miss a dose, take it as soon as you can. If it is almost time for your next dose, take only that dose. Do not take double or extra doses. What may interact with this medicine?  antihistamines for allergy, cough and cold  atropine  certain medicines for bladder problems like oxybutynin, tolterodine  certain medicines for Parkinson's disease like benztropine, trihexyphenidyl  certain medicines for stomach problems like dicyclomine, hyoscyamine  certain medicines for travel sickness like scopolamine  clarithromycin  erythromycin  ipratropium  medicines for fungal infections, like fluconazole, itraconazole, ketoconazole or voriconazole This list may not describe all possible interactions. Give your health care provider a list of all the medicines, herbs, non-prescription drugs, or dietary supplements you use. Also tell them if you smoke, drink alcohol, or use illegal drugs. Some items may interact with your medicine. What should I watch for while using this medicine? It may take a few weeks to notice the full benefit from this medicine. You may need to limit your intake tea, coffee, caffeinated sodas, and alcohol. These drinks may make your symptoms worse. You may get drowsy or dizzy. Do not drive, use machinery, or do anything that needs mental alertness until you know how this medicine affects you. Do not stand or sit up quickly, especially if you are an older patient. This reduces the risk of dizzy  or fainting spells. Alcohol may interfere with the effect of this medicine. Avoid alcoholic drinks. Your mouth may get dry. Chewing sugarless gum or sucking hard candy, and drinking plenty of water may help. Contact your doctor if the problem does not go away or is severe. This medicine may cause dry eyes and blurred vision. If you wear contact lenses, you may feel some discomfort. Lubricating drops may help. See your eyecare professional if the problem does not go away or is severe. You may notice the shells of the tablets in your stool from time to time. This is normal. Avoid extreme heat. This medicine can cause you to sweat less than normal. Your body temperature could increase to dangerous levels, which may lead to heat stroke. What side effects may I notice from receiving this medicine? Side effects that you should report to your doctor or health care professional as soon as possible:  allergic reactions like skin rash, itching or hives, swelling of the face, lips, or tongue  agitation  breathing problems  confusion  fever  flushing (  reddening of the skin)  hallucinations  memory loss  pain or difficulty passing urine  palpitations  unusually weak or tired Side effects that usually do not require medical attention (report to your doctor or health care professional if they continue or are bothersome):  constipation  headache  sexual difficulties (impotence) This list may not describe all possible side effects. Call your doctor for medical advice about side effects. You may report side effects to FDA at 1-800-FDA-1088. Where should I keep my medicine? Keep out of the reach of children. Store at room temperature between 15 and 30 degrees C (59 and 86 degrees F). Protect from moisture and humidity. Throw away any unused medicine after the expiration date. NOTE: This sheet is a summary. It may not cover all possible information. If you have questions about this medicine, talk  to your doctor, pharmacist, or health care provider.  2020 Elsevier/Gold Standard (2013-07-17 10:57:06)

## 2019-12-09 NOTE — Progress Notes (Signed)
Gynecology Evaluation  Counsultant: Dr Bethann Punches, MD Chief Complaint: Urinary Urgency  History of Present Illness:   Patient is a 69 y.o. [id number], presents today for a problem visit.  The patient is menopausal. She complains of urinary symptoms of urinary urgency. Patient reports her symptoms began about 6 mos ago and its severity is described as moderate.  Her symptoms include vaginal pressure and burning as well, and dyspareunia when active.  She is not having leakage of urine with stressful activities, such as coughing, sneezing, laughter, or exercise.  She is having leakage of urine based on symptoms of urgency or bladder spasm/discomfort.  She is having nocturia, with 3 episodes per night.  She is using pads or diapers daily for control of symptoms. She is not having dysuria.  She has not had a history of UTI.  She drinks about 1 caffeinated drinks per day.  She has not had prior procedures for incontinence.  Testing for UTI x2 negative  PMHx: She  has a past medical history of Allergy, Cancer ([place]), Diabetes mellitus without complication ([place]), GERD (gastroesophageal reflux disease), Hyperlipidemia, and Hypertension. Also,  has a past surgical history that includes Skin cancer excision; Colonoscopy; Vaginal hysterectomy; and Tubal ligation., family history includes Colon cancer in her maternal uncle, maternal uncle, maternal uncle, maternal uncle, maternal uncle, and maternal uncle; Colon polyps in her sister and sister; Prostate cancer in her father; Rectal cancer in her maternal uncle.,  reports that she has never smoked. She has never used smokeless tobacco. She reports that she does not drink alcohol and does not use drugs.  She has a current medication list which includes the following prescription(s): aspirin, esomeprazole, glipizide, losartan, pantoprazole, pravastatin, cholestyramine, diltiazem, irbesartan, oxybutynin, and sitagliptin-metformin. Also, is allergic to morphine and related  and pseudoephedrine.  Review of Systems  Constitutional: Negative for chills, fever and malaise/fatigue.  HENT: Negative for congestion, sinus pain and sore throat.   Eyes: Negative for blurred vision and pain.  Respiratory: Negative for cough and wheezing.   Cardiovascular: Negative for chest pain and leg swelling.  Gastrointestinal: Negative for abdominal pain, constipation, diarrhea, heartburn, nausea and vomiting.  Genitourinary: Positive for urgency. Negative for dysuria, frequency and hematuria.  Musculoskeletal: Negative for back pain, joint pain, myalgias and neck pain.  Skin: Negative for itching and rash.  Neurological: Negative for dizziness, tremors and weakness.  Endo/Heme/Allergies: Does not bruise/bleed easily.  Psychiatric/Behavioral: Negative for depression. The patient is not nervous/anxious and does not have insomnia.     Objective: BP (!) 140/80   Ht 5\' 9"  (1.753 m)   Wt 185 lb (83.9 kg)   BMI 27.32 kg/m  Physical Exam Constitutional:      General: She is not in acute distress.    Appearance: She is well-developed.  Genitourinary:     Pelvic exam was performed with patient supine.     Vagina normal.     No vaginal erythema or bleeding.     Genitourinary Comments: Cuff intact/ no lesions Mod atrophy noted Absent uterus and cervix Mild cystocele  HENT:     Head: Normocephalic and atraumatic.     Nose: Nose normal.  Abdominal:     General: There is no distension.     Palpations: Abdomen is soft.     Tenderness: There is no abdominal tenderness.  Musculoskeletal:        General: Normal range of motion.  Neurological:     Mental Status: She is alert and oriented to person,  place, and time.     Cranial Nerves: No cranial nerve deficit.  Skin:    General: Skin is warm and dry.  Psychiatric:        Attention and Perception: Attention normal.        Mood and Affect: Mood normal.        Speech: Speech normal.        Behavior: Behavior normal.         Cognition and Memory: Cognition normal.        Judgment: Judgment normal.     Female chaperone present for pelvic portion of the physical exam  Assessment: 69 y.o. [id number] for urologic evaluation of incontinence 1. Overactive bladder - medicine first, may be mixed and in need of future GSI therapy - oxybutynin (DITROPAN-XL) 10 MG 24 hr tablet; Take 1 tablet (10 mg total) by mouth at bedtime.  Dispense: 30 tablet; Refill: 11  Pain and burning and dyspareunia may improve, if not consider vag estrogen, replens or alternative therapy.  2. Screening for vaginal cancer - Cytology - PAP    Barnett Applebaum, MD, [facility], [facility] 12/09/2019  3:14 PM

## 2019-12-10 NOTE — Addendum Note (Signed)
Addended by: Gae Dry on: 12/10/2019 12:02 PM   Modules accepted: Level of Service

## 2019-12-11 LAB — CYTOLOGY - PAP: Diagnosis: NEGATIVE

## 2019-12-12 ENCOUNTER — Telehealth: Payer: Self-pay

## 2019-12-12 NOTE — Telephone Encounter (Signed)
Pt calling stating that she saw [place] this week and shes still having some burning. She is requesting uribel blue? LM with pt telling her that [facility] out of office but I will send to him for when he gets back

## 2019-12-13 ENCOUNTER — Other Ambulatory Visit: Payer: Self-pay | Admitting: Obstetrics & Gynecology

## 2019-12-13 MED ORDER — URIBEL 118 MG PO CAPS: ORAL_CAPSULE | ORAL | 1 refills | Status: DC

## 2019-12-17 ENCOUNTER — Encounter: Payer: Medicare Other | Admitting: Obstetrics & Gynecology

## 2020-01-26 NOTE — Progress Notes (Signed)
01/27/2020 9:45 AM   Angelica Murray 20-Apr-1951 [id number]  Referring provider: Kirk Ruths, MD [place] [facility] [place],  [place] [address] Chief Complaint  Patient presents with  . Benign Prostatic Hypertrophy    New Patient    HPI: Angelica Murray is a 69 y.o. female who is seen today for evaluation of urinary frequency.   The patient was presented to her PCP with dysuria and frequency on 12/01/2019. UA and urine culture were normal. She was noted to have a mild cystocele and atrophy on pelvic exam and was referred to [facility]. She was placed on oxybutynin 10 mg XL and Uribel.    AMB referral noted a vaginal mass. Patient denied having a vaginal mass and reported that this could be a potential error.    Today her PVR is 0 mL. She has urgency and frequency. She has a burning sensation in her pelvis during urination which she localizes as her urethral. She will sit in a tub of water on occasions. Uribel helped with irritation. Denies stress incontinence symptoms.   Onset of symptoms is relatively abrupt, 3 to 4 months ago and prior to this, no issues with her bladder or urethra.  Oxybutynin was not effective and caused severe reflux and dry mouth.  Denies stress incontinence or vaginal issues.  Multiple urinalysis all of which are negative, no microscopic or gross hematuria.  No recurrent infections.  She reports having leg cramps. She is on diuretics. She is on Protonix for reflux.     She notes having diabetes x 10 years, she is on generic metformin.   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 She has never been exposed to radiation or toxins.  Never smoker.    PMH: Past Medical History:  Diagnosis Date  . Allergy   . Cancer (HCC)    skin CA- squamous cell- leg; basal cell- face  . 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 Following a diabetic diet and medications are tolerated as appropriate.  . Diabetes mellitus without complication ([place])   . Essential hypertension 07/03/2014   Last Assessment & Plan:  Formatting of this note might be different from the original. Taking medications without noted side effects or dizziness.  [place] Kitchen GERD (gastroesophageal reflux disease)   . Hyperlipidemia   . Hypertension   . Pure hypercholesterolemia 07/03/2014   Last Assessment & Plan:  Formatting of this note might be different from the original. Healthy fat diet is being followed and no myalgia's are noted.    Surgical History: Past Surgical History:  Procedure Laterality Date  . COLONOSCOPY    . SKIN CANCER EXCISION    . TUBAL LIGATION    . VAGINAL HYSTERECTOMY      Home Medications:  Allergies as of 01/27/2020      Reactions   Diphenhydramine Hcl Hives   Morphine And Related Nausea And Vomiting   Pseudoephedrine Hives      Medication List       Accurate as of January 27, 2020  9:45 AM. If you have any questions, ask your nurse or doctor.        STOP taking these medications   cholestyramine 4 GM/DOSE powder Commonly known as: QUESTRAN Stopped by: Hollice Espy, MD   diltiazem 120 MG 24 hr capsule Commonly known as:  CARDIZEM CD Stopped by: Hollice Espy, MD   esomeprazole 40 MG capsule Commonly known as: NEXIUM Stopped by: Hollice Espy, MD   irbesartan 150 MG tablet Commonly known as: AVAPRO Stopped by: Hollice Espy, MD   oxybutynin 10 MG 24 hr tablet Commonly known as: DITROPAN-XL Stopped by: Hollice Espy, MD   pravastatin 40 MG tablet Commonly known as: PRAVACHOL Stopped by: Hollice Espy, MD   sitaGLIPtin-metformin 50-1000 MG tablet Commonly known as: JANUMET Stopped by: Hollice Espy, MD     TAKE these medications   aspirin 81 MG tablet Take 81 mg by mouth daily.   glipiZIDE 5 MG 24 hr tablet Commonly known  as: GLUCOTROL XL Take by mouth.   losartan 100 MG tablet Commonly known as: COZAAR   lovastatin 40 MG tablet Commonly known as: MEVACOR Take by mouth.   metFORMIN 1000 MG tablet Commonly known as: GLUCOPHAGE Take 1 tablet by mouth 2 (two) times daily with a meal.   pantoprazole 40 MG tablet Commonly known as: PROTONIX Take 40 mg by mouth daily.   Uribel 118 MG Caps Take one capsule po every 4-6 hours as needed       Allergies:  Allergies  Allergen Reactions  . Diphenhydramine Hcl Hives  . Morphine And Related Nausea And Vomiting  . Pseudoephedrine Hives    Family History: Family History  Problem Relation Age of Onset  . Prostate cancer Father   . Colon polyps Sister   . Colon cancer Maternal Uncle        6 maternal uncles had colon/rectal CA  . Rectal cancer Maternal Uncle   . Colon cancer Maternal Uncle   . Colon cancer Maternal Uncle   . Colon cancer Maternal Uncle   . Colon cancer Maternal Uncle   . Colon cancer Maternal Uncle   . Colon polyps Sister   . Esophageal cancer Neg Hx   . Stomach cancer Neg Hx   . Bladder Cancer Neg Hx   . Kidney cancer Neg Hx     Social History:  reports that she has never smoked. She has never used smokeless tobacco. She reports that she does not drink alcohol and does not use drugs.   Physical Exam: BP 120/69   Pulse 86   Ht 5\' 9"  (1.753 m)   Wt 186 lb (84.4 kg)   BMI 27.47 kg/m   Constitutional:  Alert and oriented, No acute distress. HEENT: [place] AT, moist mucus membranes.  Trachea midline, no masses. Cardiovascular: No clubbing, cyanosis, or edema. Respiratory: Normal respiratory effort, no increased work of breathing. Skin: No rashes, bruises or suspicious lesions. Neurologic: Grossly intact, no focal deficits, moving all 4 extremities. Psychiatric: Normal mood and affect.  Urinalysis negative  Pertinent Imaging: PVR 0  Assessment & Plan:    1. OAB/Urinary urge incontinence Today's UA  Unremarkable,  infection unlikely to be contributing factor  Suspect OAB related to diabetes although it is somewhat unusual that her symptoms only started 4 months ago was relatively abrupt in onset.  In the setting of urethral discomfort along with sudden onset OAB, I would like to pursue cystoscopy to rule out any underlying bladder pathology.  She agreed with this plan.  We will plan for pelvic and cystoscopy.  In the interim, we will treat her symptoms with trial of Myrbetriq as well as treat her urethral irritation as below.  -Given 4 Myrbetriq 25 mg daily, # 28 samples; I have advised the patient of the side effects of Myrbetriq, such  as: elevation in BP, urinary retention and/or HA   Discussed cystoscopy to rule out malignancy.   2. Vaginal atrophy/Uretheral irritation Counseled pt on prevention techniques including estrogen cream (pea sized amount) 3x a week on urethra tube at night time    Rx of Premarin given; reassured pt that it's safe to use estrogen cream given very low systemic absorption   Return for cysto.  [facility] [address] [place], [place] [address] [phone]  I, Selena Batten, am acting as a scribe for Dr. Hollice Espy.  I have reviewed the above documentation for accuracy and completeness, and I agree with the above.   Hollice Espy, MD  I spent 45 total minutes on the day of the encounter including pre-visit review of the medical record, face-to-face time with the patient, and post visit ordering of labs/imaging/tests.

## 2020-01-27 ENCOUNTER — Ambulatory Visit (INDEPENDENT_AMBULATORY_CARE_PROVIDER_SITE_OTHER): Payer: Medicare Other | Admitting: Urology

## 2020-01-27 ENCOUNTER — Encounter: Payer: Self-pay | Admitting: Urology

## 2020-01-27 ENCOUNTER — Other Ambulatory Visit: Payer: Self-pay

## 2020-01-27 ENCOUNTER — Ambulatory Visit: Payer: Medicare Other | Admitting: Obstetrics & Gynecology

## 2020-01-27 VITALS — BP 120/69 | HR 86 | Ht 69.0 in | Wt 186.0 lb

## 2020-01-27 DIAGNOSIS — N905 Atrophy of vulva: Secondary | ICD-10-CM | POA: Diagnosis not present

## 2020-01-27 DIAGNOSIS — R350 Frequency of micturition: Secondary | ICD-10-CM | POA: Diagnosis not present

## 2020-01-27 DIAGNOSIS — N400 Enlarged prostate without lower urinary tract symptoms: Secondary | ICD-10-CM

## 2020-01-27 LAB — MICROSCOPIC EXAMINATION: Bacteria, UA: NONE SEEN

## 2020-01-27 LAB — URINALYSIS, COMPLETE
Bilirubin, UA: NEGATIVE
Glucose, UA: NEGATIVE
Ketones, UA: NEGATIVE
Leukocytes,UA: NEGATIVE
Nitrite, UA: NEGATIVE
Protein,UA: NEGATIVE
RBC, UA: NEGATIVE
Specific Gravity, UA: 1.02 (ref 1.005–1.030)
Urobilinogen, Ur: 0.2 mg/dL (ref 0.2–1.0)
pH, UA: 5 (ref 5.0–7.5)

## 2020-01-27 NOTE — Patient Instructions (Addendum)
Estrogen Cream Instruction  Discard applicator  Apply pea sized amount to tip of finger to urethra before bed. Wash hands well after application. Use Monday, Wednesday and Friday  Cystoscopy Cystoscopy is a procedure that is used to help diagnose and sometimes treat conditions that affect the lower urinary tract. The lower urinary tract includes the bladder and the urethra. The urethra is the tube that drains urine from the bladder. Cystoscopy is done using a thin, tube-shaped instrument with a light and camera at the end (cystoscope). The cystoscope may be hard or flexible, depending on the goal of the procedure. The cystoscope is inserted through the urethra, into the bladder. Cystoscopy may be recommended if you have:  Urinary tract infections that keep coming back.  Blood in the urine (hematuria).  An inability to control when you urinate (urinary incontinence) or an overactive bladder.  Unusual cells found in a urine sample.  A blockage in the urethra, such as a urinary stone.  Painful urination.  An abnormality in the bladder found during an intravenous pyelogram (IVP) or CT scan. Cystoscopy may also be done to remove a sample of tissue to be examined under a microscope (biopsy). What are the risks? Generally, this is a safe procedure. However, problems may occur, including:  Infection.  Bleeding.  What happens during the procedure?  1. You will be given one or more of the following: ? A medicine to numb the area (local anesthetic). 2. The area around the opening of your urethra will be cleaned. 3. The cystoscope will be passed through your urethra into your bladder. 4. Germ-free (sterile) fluid will flow through the cystoscope to fill your bladder. The fluid will stretch your bladder so that your health care provider can clearly examine your bladder walls. 5. Your doctor will look at the urethra and bladder. 6. The cystoscope will be removed The procedure may vary  among health care providers  What can I expect after the procedure? After the procedure, it is common to have: 1. Some soreness or pain in your abdomen and urethra. 2. Urinary symptoms. These include: ? Mild pain or burning when you urinate. Pain should stop within a few minutes after you urinate. This may last for up to 1 week. ? A small amount of blood in your urine for several days. ? Feeling like you need to urinate but producing only a small amount of urine. Follow these instructions at home: General instructions  Return to your normal activities as told by your health care provider.   Do not drive for 24 hours if you were given a sedative during your procedure.  Watch for any blood in your urine. If the amount of blood in your urine increases, call your health care provider.  If a tissue sample was removed for testing (biopsy) during your procedure, it is up to you to get your test results. Ask your health care provider, or the department that is doing the test, when your results will be ready.  Drink enough fluid to keep your urine pale yellow.  Keep all follow-up visits as told by your health care provider. This is important. Contact a health care provider if you:  Have pain that gets worse or does not get better with medicine, especially pain when you urinate.  Have trouble urinating.  Have more blood in your urine. Get help right away if you:  Have blood clots in your urine.  Have abdominal pain.  Have a fever or chills.  Are  unable to urinate. Summary  Cystoscopy is a procedure that is used to help diagnose and sometimes treat conditions that affect the lower urinary tract.  Cystoscopy is done using a thin, tube-shaped instrument with a light and camera at the end.  After the procedure, it is common to have some soreness or pain in your abdomen and urethra.  Watch for any blood in your urine. If the amount of blood in your urine increases, call your health  care provider.  If you were prescribed an antibiotic medicine, take it as told by your health care provider. Do not stop taking the antibiotic even if you start to feel better. This information is not intended to replace advice given to you by your health care provider. Make sure you discuss any questions you have with your health care provider. Document Revised: 04/23/2018 Document Reviewed: 04/23/2018 Elsevier Patient Education  [place].

## 2020-02-07 NOTE — Progress Notes (Signed)
   02/10/2020  CC:  Chief Complaint  Patient presents with  . Cysto    HPI: Angelica Murray is a 69 y.o. female who presents today for a cystoscopy.   The patient was presented to her PCP with dysuria and frequency on 12/01/2019. UA and urine culture were normal. She was noted to have a mild cystocele and atrophy on pelvic exam and was referred to [facility]. She was placed on oxybutynin 10 mg XL and Uribel.     Last time, she had urgency and frequency. She had a burning sensation in her pelvis during urination which she localizes as her urethral. She would sit in a tub of water on occasions. Uribel helped with irritation. Denied stress incontinence symptoms.   Onset of symptoms was relatively abrupt, 3 to 4 months ago and prior to this, no issues with her bladder or urethra. Oxybutynin was not effective and caused severe reflux and dry mouth. Denied stress incontinence or vaginal issues. Multiple urinalysis all of which are negative, no microscopic or gross hematuria.  No recurrent infections.  Never smoker.   Since last visit, she tried Myrbetriq.  She was only able to tolerate this 1 week.  Has been using topical estrogen cream and believes that this is helped the most.  Overall she is doing much much better, pleased with voiding.  Blood pressure (!) 164/77, pulse 80, height 5\' 9"  (1.753 m), weight 187 lb (84.8 kg). NED. A&Ox3.   No respiratory distress   Abd soft, NT, ND Normal external genitalia with patent urethral meatus with small urethral caruncle Stage 1 cystocele appreciated with atrophy   Cystoscopy Procedure Note  Patient identification was confirmed, informed consent was obtained, and patient was prepped using Betadine solution.  Lidocaine jelly was administered per urethral meatus.    Procedure: - Flexible cystoscope introduced, without any difficulty.   - Thorough search of the bladder revealed:    urethral meatus with small caruncle.     normal  urothelium    no stones    no ulcers     no tumors    no urethral polyps    no trabeculation  - Ureteral orifices were normal in position and appearance.  Post-Procedure: - Patient tolerated the procedure well  Assessment/ Plan:  1. OAB/Urinary urge incontinence  Symptoms are improving on no meds  Unable to tolerate myrbetriq but improving  No bladder pathology   2. Vaginal atrophy/small urethral caruncle Counseled pt on prevention techniques including  estrogen cream (pea sized amount) 3x a week on urethra tube at night time  Symptoms improved after starting this med   Return if symptoms worsen or fail to improve.  Fransico Him, am acting as a scribe for Dr. Hollice Espy.  I have reviewed the above documentation for accuracy and completeness, and I agree with the above.   Hollice Espy, MD

## 2020-02-10 ENCOUNTER — Encounter: Payer: Self-pay | Admitting: Urology

## 2020-02-10 ENCOUNTER — Other Ambulatory Visit: Payer: Self-pay

## 2020-02-10 ENCOUNTER — Ambulatory Visit (INDEPENDENT_AMBULATORY_CARE_PROVIDER_SITE_OTHER): Payer: Medicare Other | Admitting: Urology

## 2020-02-10 VITALS — BP 164/77 | HR 80 | Ht 69.0 in | Wt 187.0 lb

## 2020-02-10 DIAGNOSIS — R350 Frequency of micturition: Secondary | ICD-10-CM | POA: Diagnosis not present

## 2020-02-10 MED ORDER — PREMARIN 0.625 MG/GM VA CREA: TOPICAL_CREAM | VAGINAL | 12 refills | Status: DC

## 2020-03-28 ENCOUNTER — Other Ambulatory Visit: Payer: Self-pay | Admitting: Obstetrics & Gynecology

## 2021-02-14 NOTE — Progress Notes (Signed)
02/15/21 4:38 PM   Angelica Murray February 13, 1951 [id number]  Referring provider:  Kirk Ruths, MD [facility] [place],  [place] [address] Chief Complaint  Patient presents with   Urinary Frequency     HPI: Angelica Murray is a 70 y.o.female with a personal history of OAB/urinary urge incontinence and vaginal atrophy/small urethral caruncle, who presents today for a 1 year follow-up.   She reports that she was doing well for the first 6 months specially starting topical estrogen cream.  Over the past 6 months however, she stopped using this medication her symptoms have recurred.  She is restarted on Vesicare recently by another provider.  She does not think this is helped her urgency frequency symptoms.  She is also been having some urethral discomfort again.  She was also represcribed Uribel which does help sometimes.  Cystoscopy on 02/10/2020 was unremarkable.   She has been experiencing nocturia. She has increased her water intake recently.    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 Following a diabetic diet and medications are tolerated as appropriate.   Diabetes mellitus without complication ([place])    Essential hypertension 07/03/2014   Last Assessment & Plan:  Formatting of this note might be different from the original. Taking medications without noted side effects or dizziness.   GERD (gastroesophageal reflux disease)    Hyperlipidemia    Hypertension    Pure hypercholesterolemia 07/03/2014   Last Assessment & Plan:  Formatting of this note might be different from the original. Healthy fat diet is being followed and no myalgia's are noted.     Surgical History: Past Surgical History:  Procedure Laterality Date   COLONOSCOPY     SKIN CANCER EXCISION     TUBAL LIGATION     VAGINAL HYSTERECTOMY      Home Medications:  Allergies as of 02/15/2021       Reactions   Diphenhydramine Hcl Hives   Morphine And Related Nausea And Vomiting   Pseudoephedrine Hives        Medication List        Accurate as of February 15, 2021  4:38 PM. If you have any questions, ask your nurse or doctor.          aspirin 81 MG tablet Take 81 mg by mouth daily.   baclofen 10 MG tablet Commonly known as: LIORESAL Take 10 mg by mouth 3 (three) times daily. PRN   glipiZIDE 5 MG 24 hr tablet Commonly known as: GLUCOTROL XL Take by mouth.   losartan 100 MG tablet Commonly known as: COZAAR   lovastatin 40 MG tablet Commonly known as: MEVACOR Take by mouth.   metFORMIN 1000 MG tablet Commonly known as: GLUCOPHAGE Take 1 tablet by mouth 2 (two) times daily with a meal.   pantoprazole 40 MG tablet Commonly known as: PROTONIX Take 40 mg by mouth daily.   Premarin vaginal cream Generic drug: conjugated estrogens Estrogen Cream Instruction Discard applicator Apply pea sized amount to tip of finger to urethra before bed. Wash hands well after application. Use Monday, Wednesday and Friday What changed: additional instructions Changed by: Hollice Espy, MD  solifenacin 5 MG tablet Commonly known as: VESICARE Take by mouth.   Uro-MP 118 MG Caps TAKE ONE CAPSULE BY MOUTH EVERY 4 TO 6 HOURS AS NEEDED        Allergies:  Allergies  Allergen Reactions   Diphenhydramine Hcl Hives   Morphine And Related Nausea And Vomiting   Pseudoephedrine Hives    Family History: Family History  Problem Relation Age of Onset   Prostate cancer Father    Colon polyps Sister    Colon cancer Maternal Uncle        6 maternal uncles had colon/rectal CA   Rectal cancer Maternal Uncle    Colon cancer Maternal Uncle    Colon cancer  Maternal Uncle    Colon cancer Maternal Uncle    Colon cancer Maternal Uncle    Colon cancer Maternal Uncle    Colon polyps Sister    Esophageal cancer Neg Hx    Stomach cancer Neg Hx    Bladder Cancer Neg Hx    Kidney cancer Neg Hx     Social History:  reports that she has never smoked. She has never used smokeless tobacco. She reports that she does not drink alcohol and does not use drugs.   Physical Exam: BP (!) 169/84   Pulse 77   Ht 5\' 9"  (1.753 m)   Wt 183 lb (83 kg)   BMI 27.02 kg/m   Constitutional:  Alert and oriented, No acute distress. HEENT: [place] AT, moist mucus membranes.  Trachea midline, no masses. Cardiovascular: No clubbing, cyanosis, or edema. Respiratory: Normal respiratory effort, no increased work of breathing. Skin: No rashes, bruises or suspicious lesions. Neurologic: Grossly intact, no focal deficits, moving all 4 extremities. Psychiatric: Normal mood and affect.  Laboratory Data: Lab Results  Component Value Date   CREATININE 0.8 02/16/2009    Urinalysis  - >30 wbcs, and many bacteria   Pertinent Imaging: Results for orders placed or performed in visit on 02/15/21  BLADDER SCAN AMB NON-IMAGING  Result Value Ref Range   Scan Result 54ml     Assessment & Plan:    Vaginal atrophy/small urethral caruncle - Bothersome symptoms  - previously did better on topical estrogen cream  - Prescribed vaginal estrogen cream  for symptoms  --requested refil uribel; refill was sent today.  2. OAB/urinary urge incontinence  - She is voiding adequately with a PVR of 74 mL.  -  urinalysis showed >30 wbcs, and many bacteria will send culture but in the absence of overt UTI symptoms today specifically, we will hold off on treating until the culture is resulted.  She was advised to call us if her symptoms worsen in the interim. - she was advised after a month to call if she experiences ongoing symptoms, we can start adjusting her medications or make changes but  I like her to address #1 and see if her urgency frequency and nocturia symptoms resolve/improve as they have done in the past -Discussed behavioral modification as relates to nocturia  F/u annually with PA or sooner as needed  [facility] [address] [place], [place] [address] [phone]

## 2021-02-15 ENCOUNTER — Ambulatory Visit (INDEPENDENT_AMBULATORY_CARE_PROVIDER_SITE_OTHER): Payer: Medicare Other | Admitting: Urology

## 2021-02-15 ENCOUNTER — Other Ambulatory Visit: Payer: Self-pay

## 2021-02-15 ENCOUNTER — Encounter: Payer: Self-pay | Admitting: Urology

## 2021-02-15 VITALS — BP 169/84 | HR 77 | Ht 69.0 in | Wt 183.0 lb

## 2021-02-15 DIAGNOSIS — R350 Frequency of micturition: Secondary | ICD-10-CM

## 2021-02-15 LAB — BLADDER SCAN AMB NON-IMAGING

## 2021-02-15 MED ORDER — URO-MP 118 MG PO CAPS
ORAL_CAPSULE | ORAL | 12 refills | Status: AC
Start: 2021-02-15 — End: ?

## 2021-02-15 MED ORDER — PREMARIN 0.625 MG/GM VA CREA: TOPICAL_CREAM | VAGINAL | 12 refills | Status: DC

## 2021-02-15 NOTE — Patient Instructions (Signed)
Estrogen Cream Instruction  Discard applicator  Apply pea sized amount to tip of finger to urethra before bed. Wash hands well after application. Use Monday, Wednesday and Friday

## 2021-02-16 ENCOUNTER — Telehealth: Payer: Self-pay | Admitting: *Deleted

## 2021-02-16 LAB — MICROSCOPIC EXAMINATION: WBC, UA: >30 /hpf — AB (ref 0–5)

## 2021-02-16 LAB — URINALYSIS, COMPLETE
Bilirubin, UA: NEGATIVE
Glucose, UA: NEGATIVE
Ketones, UA: NEGATIVE
Nitrite, UA: NEGATIVE
Protein,UA: NEGATIVE
Specific Gravity, UA: 1.01 (ref 1.005–1.030)
Urobilinogen, Ur: 0.2 mg/dL (ref 0.2–1.0)
pH, UA: 6 (ref 5.0–7.5)

## 2021-02-16 NOTE — Telephone Encounter (Signed)
Patient left voice mail today and states the  premarin cream was 200.00 at [facility]. She said that was to much , Is there anything we can do ?

## 2021-02-17 MED ORDER — ESTRADIOL 0.1 MG/GM VA CREA: TOPICAL_CREAM | VAGINAL | 12 refills | Status: DC

## 2021-02-17 NOTE — Addendum Note (Signed)
Addended by: Verlene Mayer A on: 02/17/2021 03:54 PM   Modules accepted: Orders

## 2021-02-17 NOTE — Telephone Encounter (Signed)
Left patient a VM sent in Estrace to [facility].

## 2021-02-18 ENCOUNTER — Telehealth: Payer: Self-pay

## 2021-02-18 MED ORDER — SULFAMETHOXAZOLE-TRIMETHOPRIM 800-160 MG PO TABS: 1.0000 | ORAL_TABLET | Freq: Two times a day (BID) | ORAL | 0 refills | Status: AC

## 2021-02-18 NOTE — Telephone Encounter (Signed)
Left VM sent in Bactrim to [name] per Dr. Erlene Quan. Asked to return call with any questions.

## 2021-02-18 NOTE — Telephone Encounter (Signed)
Pt called with worsening symptoms, increased frequency and worsening odor.  Her culture is not back yet. She is wondering if we can call in an antibiotic for the weekending

## 2021-02-21 LAB — CULTURE, URINE COMPREHENSIVE

## 2021-08-17 ENCOUNTER — Other Ambulatory Visit: Payer: Self-pay | Admitting: Internal Medicine

## 2021-08-17 DIAGNOSIS — R1013 Epigastric pain: Secondary | ICD-10-CM

## 2021-08-25 ENCOUNTER — Ambulatory Visit
Admission: RE | Admit: 2021-08-25 | Discharge: 2021-08-25 | Disposition: A | Discharge status: Home / Self Care | Payer: Medicare Other | Source: Ambulatory Visit | Attending: Internal Medicine | Admitting: Internal Medicine

## 2021-08-25 DIAGNOSIS — R1013 Epigastric pain: Secondary | ICD-10-CM

## 2021-09-30 ENCOUNTER — Ambulatory Visit (INDEPENDENT_AMBULATORY_CARE_PROVIDER_SITE_OTHER): Payer: Medicare Other | Admitting: Cardiovascular Disease

## 2021-09-30 ENCOUNTER — Encounter: Payer: Self-pay | Admitting: Cardiovascular Disease

## 2021-09-30 VITALS — BP 145/70 | HR 87 | Ht 69.0 in | Wt 186.0 lb

## 2021-09-30 DIAGNOSIS — E7800 Pure hypercholesterolemia, unspecified: Secondary | ICD-10-CM

## 2021-09-30 DIAGNOSIS — I10 Essential (primary) hypertension: Secondary | ICD-10-CM | POA: Diagnosis not present

## 2021-09-30 DIAGNOSIS — R072 Precordial pain: Secondary | ICD-10-CM | POA: Diagnosis not present

## 2021-09-30 DIAGNOSIS — R0789 Other chest pain: Secondary | ICD-10-CM

## 2021-09-30 MED ORDER — METOPROLOL TARTRATE 100 MG PO TABS: 100.0000 mg | ORAL_TABLET | Freq: Once | ORAL | 0 refills | Status: AC

## 2021-09-30 NOTE — Progress Notes (Signed)
09/30/2021 [name]   Sep 15, 1950  [id number]  Primary Physician Kirk Ruths, MD Primary Cardiologist: Lorretta Harp MD Lupe Carney, [place]  HPI:  Angelica Murray is a 71 y.o. thin-appearing recently widowed Caucasian female mother of 2, grandmother of 3 grandchildren is accompanied by her daughter Caryl Pina today.  She is referred by Dr. Ouida Sills, her PCP for atypical chest pain.  Her husband Angelica Murray unfortunately recently passed away after having been resuscitated from a sudden cardiac death event.  I had taken care of him for over 30 years.  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  She does have a history of treated hypertension, hyperlipidemia and diabetes.  There is no family history for heart disease.  She is never had heart attack or stroke.  She has had epigastric pain for last 2 or 3 months occurring once or twice a week usually occurring after certain meals and radiating to her back lasting for minutes or longer.  She is seeing a gastroenterologist and is on a proton pump inhibitor with little relief so far.   Current Meds  Medication Sig   aspirin 81 MG tablet Take 81 mg by mouth daily.   baclofen (LIORESAL) 10 MG tablet Take 10 mg by mouth 3 (three) times daily. PRN   losartan (COZAAR) 100 MG tablet    lovastatin (MEVACOR) 40 MG tablet Take by mouth.   metFORMIN (GLUCOPHAGE) 1000 MG tablet Take 1 tablet by mouth 2 (two) times daily with a meal.   Meth-Hyo-M Bl-Na Phos-Ph Sal (URO-MP) 118 MG CAPS TAKE ONE CAPSULE BY MOUTH EVERY 4 TO 6 HOURS AS NEEDED   pantoprazole (PROTONIX) 40 MG tablet Take 40 mg by mouth daily.   sulfamethoxazole-trimethoprim (BACTRIM DS) 800-160 MG tablet Take 1 tablet by mouth every 12 (twelve) hours.     Allergies  Allergen Reactions   Diphenhydramine Hcl Hives   Morphine And Related Nausea And Vomiting   Pseudoephedrine Hives    Social History   Socioeconomic History   Marital status:  Married    Spouse name: Not on file   Number of children: Not on file   Years of education: Not on file   Highest education level: Not on file  Occupational History   Not on file  Tobacco Use   Smoking status: Never   Smokeless tobacco: Never  Vaping Use   Vaping Use: Never used  Substance and Sexual Activity   Alcohol use: No   Drug use: No   Sexual activity: Not on file  Other Topics Concern   Not on file  Social History Narrative   Not on file   Social Determinants of Health   Financial Resource Strain: Not on file  Food Insecurity: Not on file  Transportation Needs: Not on file  Physical Activity: Not on file  Stress: Not on file  Social Connections: Not on file  Intimate Partner Violence: Not on file     Review of Systems: General: negative for chills, fever, night sweats or weight changes.  Cardiovascular: negative for chest pain, dyspnea on exertion, edema, orthopnea, palpitations, paroxysmal nocturnal dyspnea or shortness of breath Dermatological: negative for rash Respiratory: negative for cough or wheezing Urologic: negative for hematuria Abdominal: negative for nausea, vomiting, diarrhea, bright red blood per rectum, melena, or hematemesis Neurologic: negative for visual changes, syncope, or dizziness All other systems reviewed and are otherwise negative except as noted above.    Blood pressure Marland Kitchen)  145/70, pulse 87, height '5\' 9"'$  (1.753 m), weight 186 lb (84.4 kg), SpO2 97 %.  General appearance: alert and no distress Neck: no adenopathy, no carotid bruit, no JVD, supple, symmetrical, trachea midline, and thyroid not enlarged, symmetric, no tenderness/mass/nodules Lungs: clear to auscultation bilaterally Heart: regular rate and rhythm, S1, S2 normal, no murmur, click, rub or gallop Extremities: extremities normal, atraumatic, no cyanosis or edema Pulses: 2+ and symmetric Skin: Skin color, texture, turgor normal. No rashes or lesions Neurologic: Grossly  normal  EKG sinus rhythm at 87 without ST or T wave changes.  I personally reviewed this EKG.  ASSESSMENT AND PLAN:   Pure hypercholesterolemia Of hyperlipidemia on statin therapy with lipid profile performed 1 month ago revealing total cholesterol 160, LDL of 79 and HDL of 44.  Essential hypertension History of essential hypertension a blood pressure measured today at 145/70.  She is on losartan.  Atypical chest pain History of atypical chest pain which occurs several times a week.  It began several months ago.  It occurs principally after eating meals, is epigastric and radiates to her back.  She is seeing a gastroenterologist and is on a proton pump inhibitor.  Given her cardiovascular risk factors I am going to get a coronary CTA to further evaluate.     Lorretta Harp MD FACP,FACC,FAHA, [facility] 09/30/2021 9:49 AM

## 2021-09-30 NOTE — Assessment & Plan Note (Signed)
History of atypical chest pain which occurs several times a week.  It began several months ago.  It occurs principally after eating meals, is epigastric and radiates to her back.  She is seeing a gastroenterologist and is on a proton pump inhibitor.  Given her cardiovascular risk factors I am going to get a coronary CTA to further evaluate.

## 2021-09-30 NOTE — Assessment & Plan Note (Signed)
Of hyperlipidemia on statin therapy with lipid profile performed 1 month ago revealing total cholesterol 160, LDL of 79 and HDL of 44.

## 2021-09-30 NOTE — Patient Instructions (Signed)
Medication Instructions:  Your physician recommends that you continue on your current medications as directed. Please refer to the Current Medication list given to you today.  *If you need a refill on your cardiac medications before your next appointment, please call your pharmacy*   Testing/Procedures: See below   Follow-Up: At [facility], you and your health needs are our priority.  As part of our continuing mission to provide you with exceptional heart care, we have created designated Provider Care Teams.  These Care Teams include your primary Cardiologist (physician) and Advanced Practice Providers (APPs -  Physician Assistants and Nurse Practitioners) who all work together to provide you with the care you need, when you need it.  We recommend signing up for the patient portal called "MyChart".  Sign up information is provided on this After Visit Summary.  MyChart is used to connect with patients for Virtual Visits (Telemedicine).  Patients are able to view lab/test results, encounter notes, upcoming appointments, etc.  Non-urgent messages can be sent to your provider as well.   To learn more about what you can do with MyChart, go to [email or web address].    Your next appointment:   We will see you on an as needed basis.  Provider:   Quay Burow, MD   Other Instructions   Your cardiac CT will be scheduled at the below location:   [facility] [address] [place], [place] [address] [phone]   If scheduled at [facility], please arrive at the [facility] and Children's Entrance (Entrance C2) of [facility] 30 minutes prior to test start time. You can use the FREE valet parking offered at entrance C (encouraged to control the heart rate for the test)  Proceed to the [facility] Radiology Department (first floor) to check-in and test prep.  All radiology patients and guests should use entrance C2 at [facility], accessed from [facility], even though the hospital's physical address listed is [address].     Please follow these instructions carefully (unless otherwise directed):   On the Night Before the Test: Be sure to Drink plenty of water. Do not consume any caffeinated/decaffeinated beverages or chocolate 12 hours prior to your test. Do not take any antihistamines 12 hours prior to your test.  On the Day of the Test: Drink plenty of water until 1 hour prior to the test. Do not eat any food 4 hours prior to the test. You may take your regular medications prior to the test.  Take metoprolol (Lopressor)'100mg'$  two hours prior to test. HOLD Furosemide/Hydrochlorothiazide morning of the test. FEMALES- please wear underwire-free bra if available, avoid dresses & tight clothing      After the Test: Drink plenty of water. After receiving IV contrast, you may experience a mild flushed feeling. This is normal. On occasion, you may experience a mild rash up to 24 hours after the test. This is not dangerous. If this occurs, you can take Benadryl 25 mg and increase your fluid intake. If you experience trouble breathing, this can be serious. If it is severe call 911 IMMEDIATELY. If it is mild, please call our office. If you take any of these medications: Glipizide/Metformin, Avandament, Glucavance, please do not take 48 hours after completing test unless otherwise instructed.  We will call to schedule your test 2-4 weeks out understanding that some insurance companies will need an authorization prior to the service being performed.   For non-scheduling related questions, please  contact the cardiac imaging nurse navigator should you have any questions/concerns: Marchia Bond, Cardiac Imaging Nurse Navigator Gordy Clement, Cardiac Imaging Nurse Navigator [facility] Direct Office Dial: [phone]   For scheduling needs, including cancellations and rescheduling, please  call [place], [phone].

## 2021-09-30 NOTE — Assessment & Plan Note (Signed)
History of essential hypertension a blood pressure measured today at 145/70.  She is on losartan.

## 2021-11-04 ENCOUNTER — Telehealth (HOSPITAL_COMMUNITY): Payer: Self-pay | Admitting: Emergency Medicine

## 2021-11-07 ENCOUNTER — Ambulatory Visit (HOSPITAL_COMMUNITY)
Admission: RE | Admit: 2021-11-07 | Discharge: 2021-11-07 | Disposition: A | Discharge status: Home / Self Care | Payer: Medicare Other | Source: Ambulatory Visit | Attending: Cardiovascular Disease | Admitting: Cardiovascular Disease

## 2021-11-07 DIAGNOSIS — R072 Precordial pain: Secondary | ICD-10-CM | POA: Insufficient documentation

## 2021-11-07 MED ORDER — NITROGLYCERIN 0.4 MG SL SUBL
0.8000 mg | SUBLINGUAL_TABLET | Freq: Once | SUBLINGUAL | Status: AC
Start: 2021-11-07 — End: 2021-11-07
  Administered 2021-11-07: 0.8 mg via SUBLINGUAL

## 2021-11-07 MED ORDER — METOPROLOL TARTRATE 5 MG/5ML IV SOLN
INTRAVENOUS | Status: AC
Start: 2021-11-07 — End: 2021-11-07
  Administered 2021-11-07: 5 mg
  Filled 2021-11-07: qty 10

## 2021-11-07 MED ORDER — NITROGLYCERIN 0.4 MG SL SUBL
SUBLINGUAL_TABLET | SUBLINGUAL | Status: AC
Start: 2021-11-07 — End: 2021-11-07
  Filled 2021-11-07: qty 2

## 2021-11-07 MED ORDER — IOHEXOL 350 MG/ML SOLN
100.0000 mL | Freq: Once | INTRAVENOUS | Status: AC | PRN
  Administered 2021-11-07: 100 mL via INTRAVENOUS

## 2022-02-16 ENCOUNTER — Ambulatory Visit: Payer: Medicare Other | Admitting: Physician Assistant

## 2022-06-07 ENCOUNTER — Other Ambulatory Visit: Payer: Self-pay | Admitting: Nurse Practitioner

## 2022-06-07 DIAGNOSIS — R1011 Right upper quadrant pain: Secondary | ICD-10-CM

## 2022-06-14 ENCOUNTER — Ambulatory Visit
Admission: RE | Admit: 2022-06-14 | Discharge: 2022-06-14 | Disposition: A | Discharge status: Home / Self Care | Payer: Medicare Other | Source: Ambulatory Visit | Attending: Nurse Practitioner | Admitting: Nurse Practitioner

## 2022-06-14 DIAGNOSIS — R1011 Right upper quadrant pain: Secondary | ICD-10-CM | POA: Diagnosis present

## 2022-06-14 MED ORDER — TECHNETIUM TC 99M MEBROFENIN IV KIT
5.1200 | PACK | Freq: Once | INTRAVENOUS | Status: AC | PRN
  Administered 2022-06-14: 4.97 via INTRAVENOUS

## 2023-03-29 ENCOUNTER — Encounter: Payer: Self-pay | Admitting: Internal Medicine

## 2023-04-17 ENCOUNTER — Encounter: Payer: Self-pay | Admitting: Internal Medicine

## 2023-04-18 ENCOUNTER — Ambulatory Visit
Admission: RE | Admit: 2023-04-18 | Discharge: 2023-04-18 | Disposition: A | Discharge status: Home / Self Care | Payer: Medicare Other | Attending: Internal Medicine | Admitting: Internal Medicine

## 2023-04-18 ENCOUNTER — Ambulatory Visit: Payer: Medicare Other | Admitting: Anesthesiology

## 2023-04-18 ENCOUNTER — Encounter: Payer: Self-pay | Admitting: Internal Medicine

## 2023-04-18 ENCOUNTER — Encounter
Admission: RE | Disposition: A | Discharge status: Home / Self Care | Payer: Self-pay | Source: Home / Self Care | Attending: Internal Medicine

## 2023-04-18 ENCOUNTER — Other Ambulatory Visit: Payer: Self-pay

## 2023-04-18 DIAGNOSIS — E7800 Pure hypercholesterolemia, unspecified: Secondary | ICD-10-CM | POA: Insufficient documentation

## 2023-04-18 DIAGNOSIS — I10 Essential (primary) hypertension: Secondary | ICD-10-CM | POA: Diagnosis not present

## 2023-04-18 DIAGNOSIS — Z860101 Personal history of adenomatous and serrated colon polyps: Secondary | ICD-10-CM | POA: Insufficient documentation

## 2023-04-18 DIAGNOSIS — Z7984 Long term (current) use of oral hypoglycemic drugs: Secondary | ICD-10-CM | POA: Insufficient documentation

## 2023-04-18 DIAGNOSIS — D123 Benign neoplasm of transverse colon: Secondary | ICD-10-CM | POA: Diagnosis not present

## 2023-04-18 DIAGNOSIS — Z1211 Encounter for screening for malignant neoplasm of colon: Secondary | ICD-10-CM | POA: Insufficient documentation

## 2023-04-18 DIAGNOSIS — K219 Gastro-esophageal reflux disease without esophagitis: Secondary | ICD-10-CM | POA: Insufficient documentation

## 2023-04-18 DIAGNOSIS — K5730 Diverticulosis of large intestine without perforation or abscess without bleeding: Secondary | ICD-10-CM | POA: Insufficient documentation

## 2023-04-18 DIAGNOSIS — E119 Type 2 diabetes mellitus without complications: Secondary | ICD-10-CM | POA: Diagnosis not present

## 2023-04-18 HISTORY — PX: POLYPECTOMY: SHX5525

## 2023-04-18 HISTORY — PX: COLONOSCOPY WITH PROPOFOL: SHX5780

## 2023-04-18 HISTORY — DX: Personal history of adenomatous and serrated colon polyps: Z86.0101

## 2023-04-18 LAB — GLUCOSE, CAPILLARY: Glucose-Capillary: 160 mg/dL — ABNORMAL HIGH (ref 70–99)

## 2023-04-18 SURGERY — COLONOSCOPY WITH PROPOFOL
Anesthesia: General

## 2023-04-18 MED ORDER — PROPOFOL 500 MG/50ML IV EMUL
INTRAVENOUS | Status: DC | PRN
  Administered 2023-04-18: 145 ug/kg/min via INTRAVENOUS

## 2023-04-18 MED ORDER — PROPOFOL 1000 MG/100ML IV EMUL
INTRAVENOUS | Status: AC
  Filled 2023-04-18: qty 200

## 2023-04-18 MED ORDER — SODIUM CHLORIDE 0.9 % IV SOLN: INTRAVENOUS | Status: DC

## 2023-04-18 MED ORDER — PROPOFOL 10 MG/ML IV BOLUS
INTRAVENOUS | Status: DC | PRN
  Administered 2023-04-18: 50 mg via INTRAVENOUS

## 2023-04-18 NOTE — Anesthesia Postprocedure Evaluation (Signed)
Anesthesia Post Note  Patient: Angelica Murray  Procedure(s) Performed: COLONOSCOPY WITH PROPOFOL POLYPECTOMY  Patient location during evaluation: Endoscopy Anesthesia Type: General Level of consciousness: awake and alert Pain management: pain level controlled Vital Signs Assessment: post-procedure vital signs reviewed and stable Respiratory status: spontaneous breathing, nonlabored ventilation, respiratory function stable and patient connected to nasal cannula oxygen Cardiovascular status: blood pressure returned to baseline and stable Postop Assessment: no apparent nausea or vomiting Anesthetic complications: no   No notable events documented.   Last Vitals:  Vitals:   04/18/23 0824 04/18/23 0907  BP: (!) 149/67 (!) 111/53  Pulse: 71 70  Resp: 18 18  Temp: (!) 36.4 C (!) 36.3 C  SpO2: 100% 100%    Last Pain:  Vitals:   04/18/23 0927  TempSrc:   PainSc: 0-No pain                 Corinda Gubler

## 2023-04-18 NOTE — Anesthesia Procedure Notes (Signed)
Procedure Name: MAC Date/Time: 04/18/2023 8:46 AM  Performed by: Nelle Don, CRNAPre-anesthesia Checklist: Patient identified, Emergency Drugs available, Suction available and Patient being monitored Oxygen Delivery Method: Simple face mask

## 2023-04-18 NOTE — Interval H&P Note (Signed)
History and Physical Interval Note:  04/18/2023 8:36 AM  Angelica Murray  has presented today for surgery, with the diagnosis of V12.72 (ICD-9-CM) - Z86.010 (ICD-10-CM) - Personal history of colonic polyps.  The various methods of treatment have been discussed with the patient and family. After consideration of risks, benefits and other options for treatment, the patient has consented to  Procedure(s): COLONOSCOPY WITH PROPOFOL (N/A) as a surgical intervention.  The patient's history has been reviewed, patient examined, no change in status, stable for surgery.  I have reviewed the patient's chart and labs.  Questions were answered to the patient's satisfaction.     [place], [place]

## 2023-04-18 NOTE — H&P (Signed)
Outpatient short stay form Pre-procedure 04/18/2023 8:34 AM Angelica Murray K. Norma Fredrickson, M.D.  Primary Physician: Einar Crow, M.D.  Reason for visit:  Personal history of colon polyps (2017) - Adenoma.  History of present illness:  Patient denies change in bowel habits, rectal bleeding, weight loss or abdominal pain.    Last 11/21/2015: Colonoscopy: Indic: PH colon polyps. Imp: a small TA polyp, diverticulosis, IH.Repeat 11/2022 -11/18/2021: EGD: Epigastric pain, GERD: Impression normal esophagus, 2 cm hiatal hernia, multiple gastric polyps, gastritis, nonbleeding duodenal diverticulum pathology. Pathology: Reactive gastropathy, fundic gland polyps.   Current Facility-Administered Medications:    0.9 %  sodium chloride infusion, , Intravenous, Continuous, Debra Colon, Boykin Nearing, MD  Medications Prior to Admission  Medication Sig Dispense Refill Last Dose   aspirin 81 MG tablet Take 81 mg by mouth daily.   04/17/2023   baclofen (LIORESAL) 10 MG tablet Take 10 mg by mouth 3 (three) times daily. PRN   04/17/2023   cetirizine (ZYRTEC) 10 MG tablet Take 10 mg by mouth daily.   04/17/2023   glipiZIDE (GLUCOTROL XL) 5 MG 24 hr tablet Take 5 mg by mouth daily with breakfast.   04/17/2023   losartan (COZAAR) 100 MG tablet    04/17/2023   lovastatin (MEVACOR) 40 MG tablet Take by mouth.   04/17/2023   magnesium oxide (MAG-OX) 400 (240 Mg) MG tablet Take 400 mg by mouth daily.   04/17/2023   metFORMIN (GLUCOPHAGE) 1000 MG tablet Take 1 tablet by mouth 2 (two) times daily with a meal.   04/17/2023   Meth-Hyo-M Bl-Na Phos-Ph Sal (URO-MP) 118 MG CAPS TAKE ONE CAPSULE BY MOUTH EVERY 4 TO 6 HOURS AS NEEDED 90 capsule 12 04/17/2023   pantoprazole (PROTONIX) 40 MG tablet Take 40 mg by mouth daily.   04/17/2023   solifenacin (VESICARE) 5 MG tablet Take 5 mg by mouth daily.   04/17/2023   sulfamethoxazole-trimethoprim (BACTRIM DS) 800-160 MG tablet Take 1 tablet by mouth every 12 (twelve) hours. 10 tablet 0 04/17/2023    tizanidine (ZANAFLEX) 2 MG capsule Take 2 mg by mouth at bedtime.   04/17/2023   metoprolol tartrate (LOPRESSOR) 100 MG tablet Take 1 tablet (100 mg total) by mouth once for 1 dose. Take 2 hours prior to procedure. 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 Following a diabetic diet and medications are tolerated as appropriate.   Diabetes mellitus without complication ([facility])    Essential hypertension 07/03/2014   Last Assessment & Plan:  Formatting of this note might be different from the original. Taking medications without noted side effects or dizziness.   GERD (gastroesophageal reflux disease)    Hx of adenomatous colonic polyps    Hyperlipidemia    Hypertension    Pure hypercholesterolemia 07/03/2014   Last Assessment & Plan:  Formatting of this note might be different from the original. Healthy fat diet is being followed and no myalgia's are noted.    Review of systems:  Otherwise negative.    Physical Exam  Gen:  Alert, oriented. Appears stated age.  HEENT: /AT. PERRLA. Lungs: CTA, no wheezes. CV: RR nl S1, S2. Abd: soft, benign, no masses. BS+ Ext: No edema. Pulses 2+    Planned procedures: Proceed with colonoscopy. The patient understands the nature of the planned procedure, indications, risks, alternatives and potential complications including but not limited to bleeding, infection, perforation, damage to internal organs and possible oversedation/side effects from anesthesia. The patient agrees and gives consent to proceed.  Please refer to  procedure notes for findings, recommendations and patient disposition/instructions.     Angelica Murray K. Norma Fredrickson, M.D. Gastroenterology 04/18/2023  8:34 AM

## 2023-04-18 NOTE — Anesthesia Preprocedure Evaluation (Signed)
Anesthesia Evaluation  Patient identified by MRN, date of birth, ID band Patient awake    Reviewed: Allergy & Precautions, NPO status , Patient's Chart, lab work & pertinent test results  History of Anesthesia Complications Negative for: history of anesthetic complications  Airway Mallampati: II  TM Distance: >3 FB Neck ROM: Full    Dental no notable dental hx. (+) Teeth Intact   Pulmonary neg pulmonary ROS, neg sleep apnea, neg COPD, Patient abstained from smoking.Not current smoker   Pulmonary exam normal breath sounds clear to auscultation       Cardiovascular Exercise Tolerance: Good METShypertension, Pt. 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 Following a diabetic diet              and medications are tolerated as appropriate. No date: Diabetes mellitus without complication (HCC) 07/03/2014: Essential hypertension     Comment:  Last Assessment & Plan:  Formatting of this note might               be different from the original. Taking medications               without noted side effects or dizziness. No date: GERD (gastroesophageal reflux disease) No date: Hx of adenomatous colonic polyps No date:  Hyperlipidemia No date: Hypertension 07/03/2014: Pure hypercholesterolemia     Comment:  Last Assessment & Plan:  Formatting of this note might               be different from the original. Healthy fat diet is being              followed and no myalgia's are noted.  Reproductive/Obstetrics                             Anesthesia Physical Anesthesia Plan  ASA: 2  Anesthesia Plan: General   Post-op Pain Management: Minimal or no pain anticipated   Induction: Intravenous  PONV Risk Score and Plan: 3 and Propofol infusion, TIVA and Ondansetron  Airway Management Planned: Nasal Cannula  Additional Equipment: None  Intra-op Plan:   Post-operative Plan:   Informed Consent: I have reviewed the patients History and Physical, chart, labs and discussed the procedure including the risks, benefits and alternatives for the proposed anesthesia with the patient or authorized representative who has indicated his/her understanding and acceptance.     Dental advisory given  Plan Discussed with: CRNA and Surgeon  Anesthesia Plan Comments: (Discussed risks of anesthesia with patient, including possibility of difficulty with spontaneous ventilation under anesthesia necessitating airway intervention, PONV, and rare risks such as cardiac or respiratory  or neurological events, and allergic reactions. Discussed the role of CRNA in patient's perioperative care. Patient understands.)       Anesthesia Quick Evaluation

## 2023-04-18 NOTE — Transfer of Care (Signed)
Immediate Anesthesia Transfer of Care Note  Patient: Angelica Murray  Procedure(s) Performed: COLONOSCOPY WITH PROPOFOL POLYPECTOMY  Patient Location: PACU  Anesthesia Type:General  Level of Consciousness: drowsy  Airway & Oxygen Therapy: Patient Spontanous Breathing and Patient connected to face mask oxygen  Post-op Assessment: Report given to RN, Post -op Vital signs reviewed and stable, and Patient moving all extremities X 4  Post vital signs: Reviewed and stable  Last Vitals:  Vitals Value Taken Time  BP 111/53 04/18/23 0908  Temp    Pulse 70 04/18/23 0908  Resp 12   SpO2 100 % 04/18/23 0908  Vitals shown include unfiled device data.  Last Pain:  Vitals:   04/18/23 0824  TempSrc: Temporal  PainSc: 0-No pain         Complications: No notable events documented.

## 2023-04-18 NOTE — Op Note (Signed)
[facility] Gastroenterology Patient Name: Angelica Murray Procedure Date: 04/18/2023 7:12 AM MRN: [id number] Account #: [id number] Date of Birth: 1950/07/30 Admit Type: Outpatient Age: 72 Room: [facility] ENDO ROOM 4 Gender: Female Note Status: Finalized Instrument Name: Nelda Marseille [id number] Procedure:             Colonoscopy Indications:           High risk colon cancer surveillance: Personal history                         of non-advanced adenoma Providers:             Boykin Nearing. Norma Fredrickson MD, MD Referring MD:          No Local Md, MD (Referring MD) Medicines:             Propofol per Anesthesia Complications:         No immediate complications. Estimated blood loss:                         Minimal. Procedure:             Pre-Anesthesia Assessment:                        - The risks and benefits of the procedure and the                         sedation options and risks were discussed with the                         patient. All questions were answered and informed                         consent was obtained.                        - Patient identification and proposed procedure were                         verified prior to the procedure by the nurse. The                         procedure was verified in the procedure room.                        - ASA Grade Assessment: III - A patient with severe                         systemic disease.                        - After reviewing the risks and benefits, the patient                         was deemed in satisfactory condition to undergo the                         procedure.                        After obtaining informed consent, the  colonoscope was                         passed under direct vision. Throughout the procedure,                         the patient's blood pressure, pulse, and oxygen                         saturations were monitored continuously. The                         Colonoscope was introduced  through the anus and                         advanced to the the cecum, identified by appendiceal                         orifice and ileocecal valve. The colonoscopy was                         somewhat difficult due to significant looping.                         Successful completion of the procedure was aided by                         applying abdominal pressure. The patient tolerated the                         procedure well. The quality of the bowel preparation                         was good. The ileocecal valve, appendiceal orifice,                         and rectum were photographed. Findings:      The perianal and digital rectal examinations were normal. Pertinent       negatives include normal sphincter tone and no palpable rectal lesions.      Many medium-mouthed and small-mouthed diverticula were found in the       sigmoid colon.      An 8 mm polyp was found in the distal transverse colon. The polyp was       sessile. The polyp was removed with a cold snare. Resection and       retrieval were complete.      The exam was otherwise without abnormality on direct and retroflexion       views. Impression:            - Diverticulosis in the sigmoid colon.                        - One 8 mm polyp in the distal transverse colon,                         removed with a cold snare. Resected and retrieved.                        - The  examination was otherwise normal on direct and                         retroflexion views. Recommendation:        - Patient has a contact number available for                         emergencies. The signs and symptoms of potential                         delayed complications were discussed with the patient.                         Return to normal activities tomorrow. Written                         discharge instructions were provided to the patient.                        - Resume previous diet.                        - Continue present  medications.                        - Await pathology results.                        - Repeat colonoscopy in 5 years for surveillance.                        - Return to GI office PRN.                        - The findings and recommendations were discussed with                         the patient. Procedure Code(s):     --- Professional ---                        [phone], Colonoscopy, flexible; with removal of                         tumor(s), polyp(s), or other lesion(s) by snare                         technique Diagnosis Code(s):     --- Professional ---                        K57.30, Diverticulosis of large intestine without                         perforation or abscess without bleeding                        D12.3, Benign neoplasm of transverse colon (hepatic                         flexure or splenic flexure)  Z86.010, Personal history of colonic polyps CPT copyright 2022 [employer]. All rights reserved. The codes documented in this report are preliminary and upon coder review may  be revised to meet current compliance requirements. Stanton Kidney MD, MD 04/18/2023 9:09:21 AM This report has been signed electronically. Number of Addenda: 0 Note Initiated On: 04/18/2023 7:12 AM Scope Withdrawal Time: 0 hours 6 minutes 46 seconds  Total Procedure Duration: 0 hours 12 minutes 29 seconds  Estimated Blood Loss:  Estimated blood loss was minimal. Estimated blood loss                         was minimal. Estimated blood loss was minimal.      [facility]

## 2023-04-19 ENCOUNTER — Encounter: Payer: Self-pay | Admitting: Internal Medicine

## 2023-04-19 LAB — SURGICAL PATHOLOGY

## 2023-12-03 IMAGING — US US ABDOMEN LIMITED
1 series · 14 of 25 positions shown · non-contrast
Comparison: CT scan February 11, 2013

CLINICAL DATA: Epigastric pain

EXAM:
ULTRASOUND ABDOMEN LIMITED RIGHT UPPER QUADRANT

[Series 1: us abdomen limited ruq (liver/gb) · 14 of 56 slices shown]
[im 1/56]
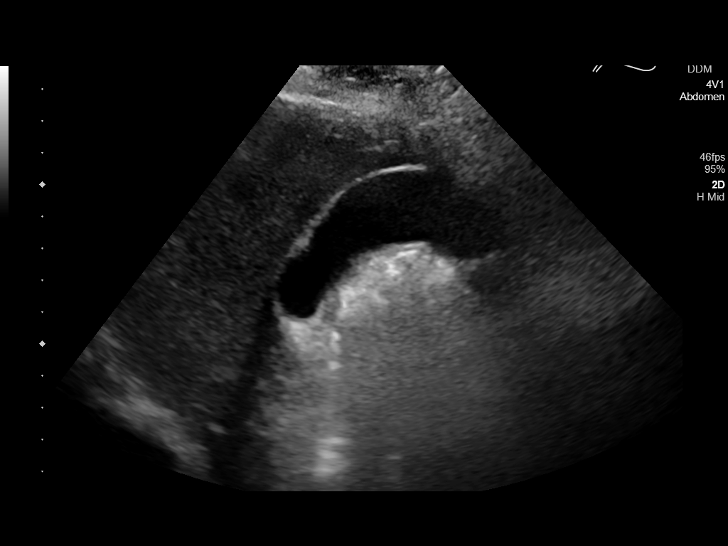
[im 5/56]
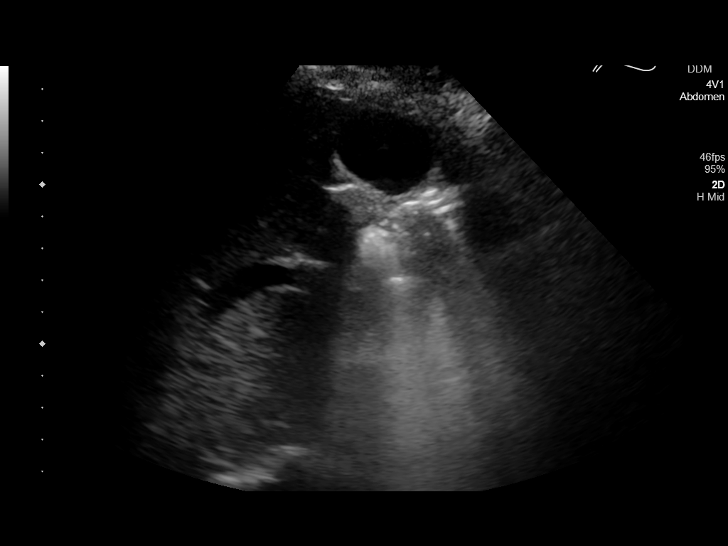
[im 10/56]
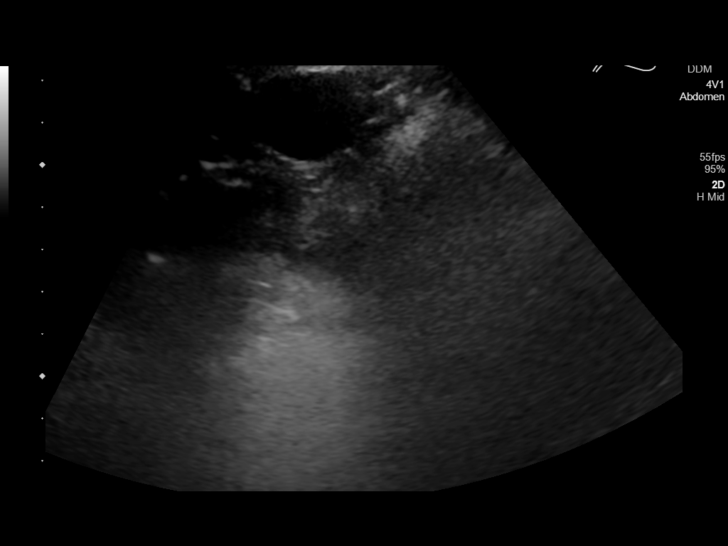
[im 14/56]
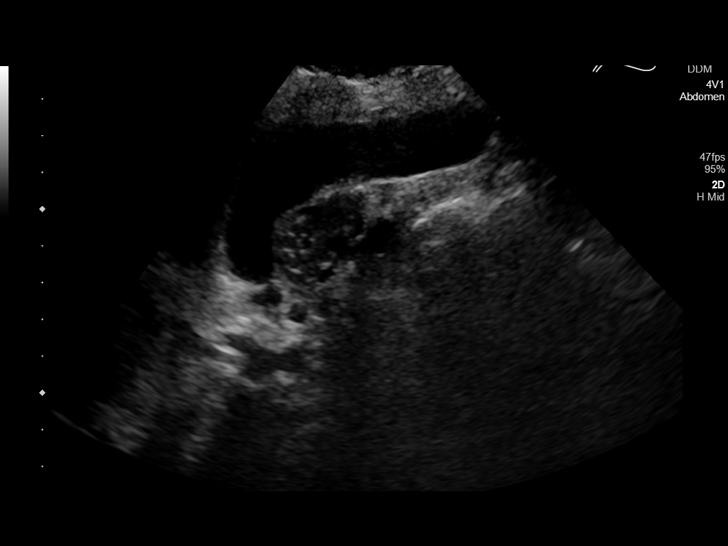
[im 19/56]
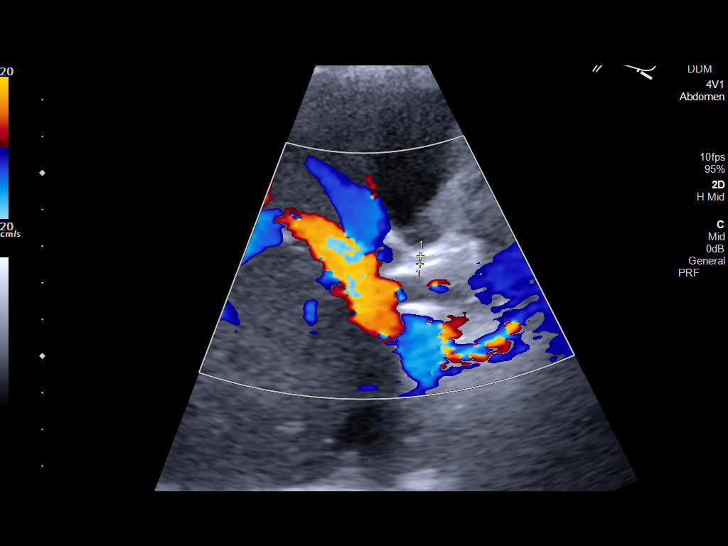
[im 21/56]
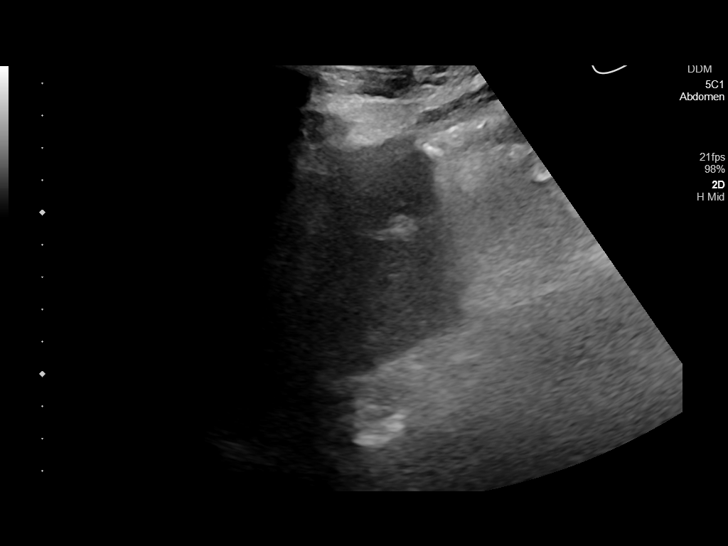
[im 26/56]
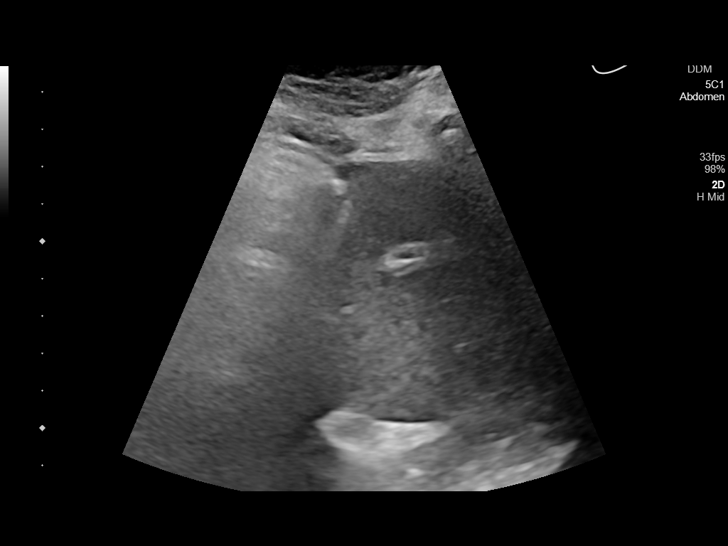
[im 30/56]
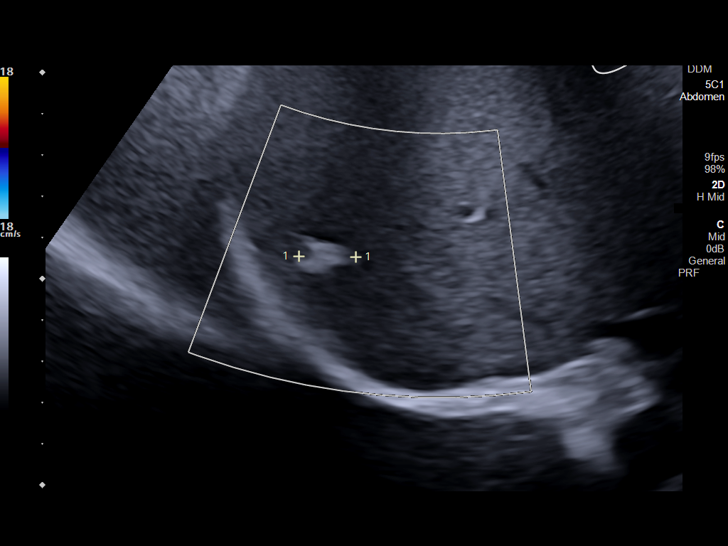
[im 35/56]
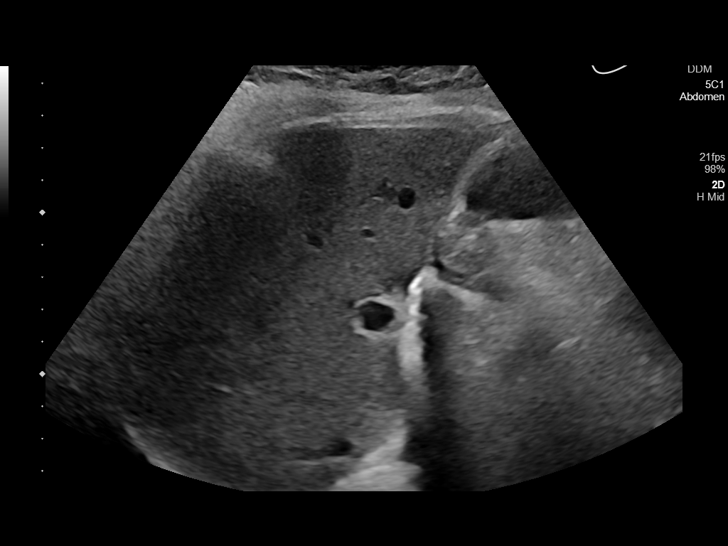
[im 37/56]
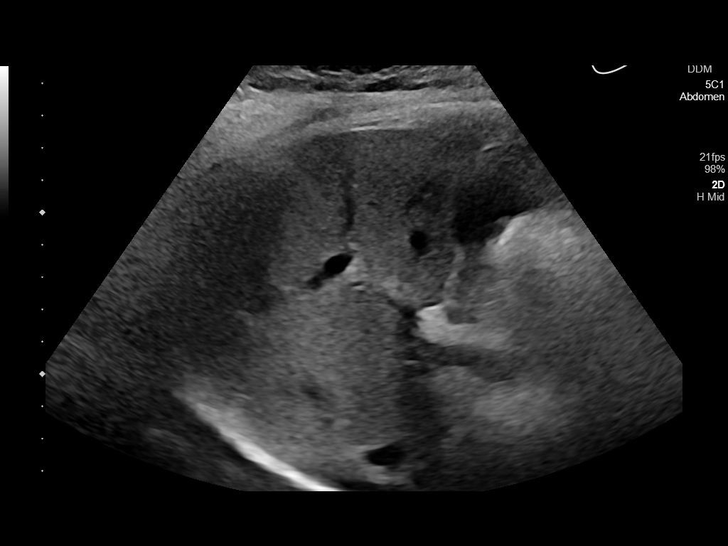
[im 42/56]
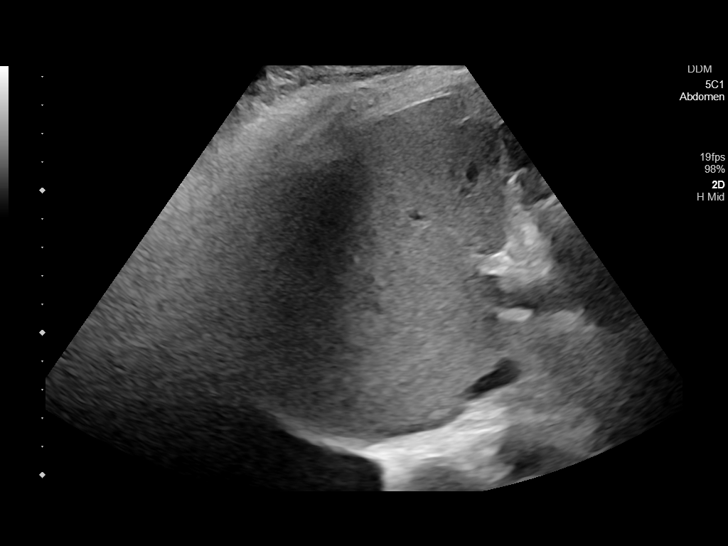
[im 46/56]
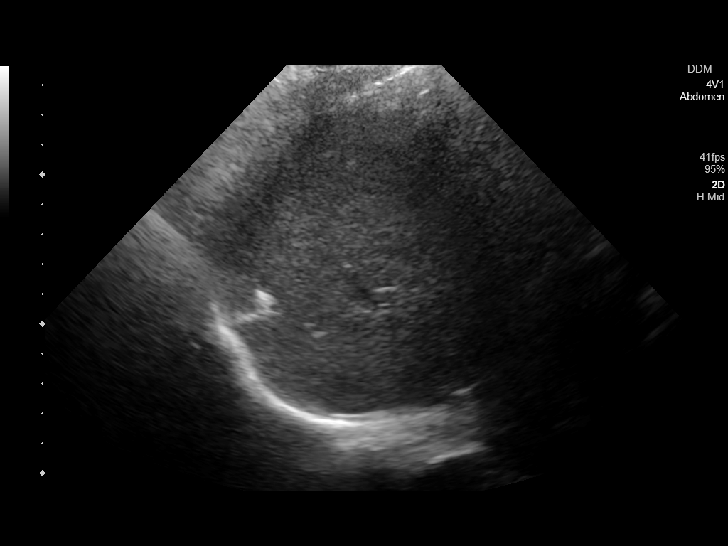
[im 51/56]
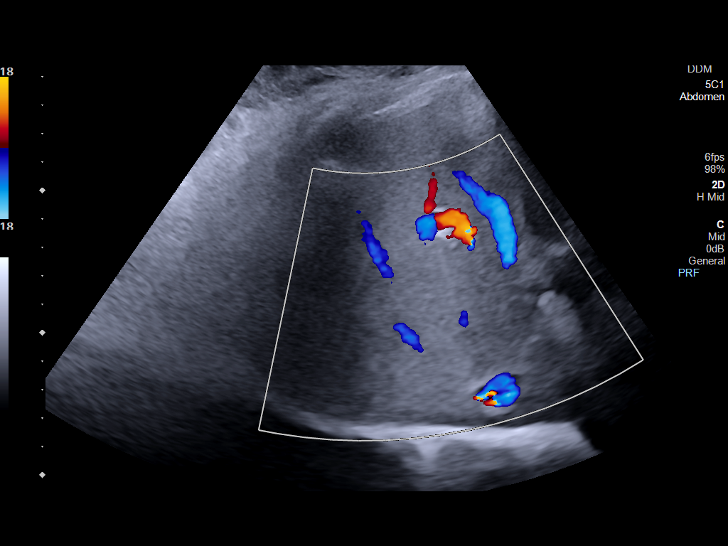
[im 56/56]
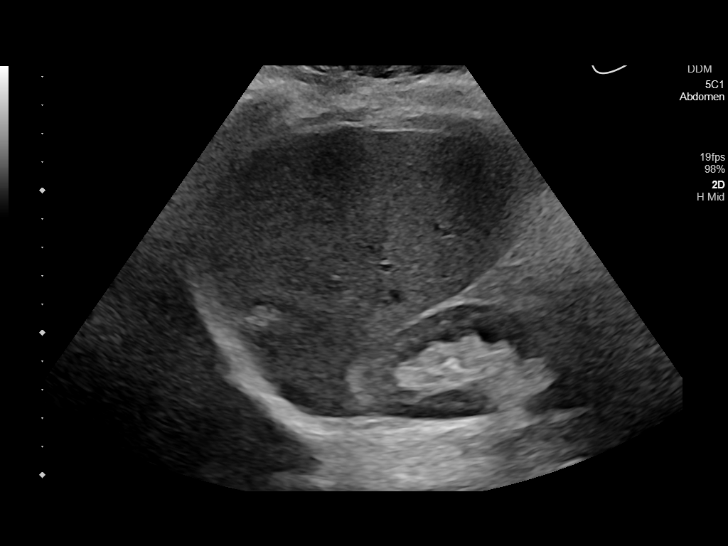

[14 of 25 positions shown; findings below may reference images not displayed]

FINDINGS: Gallbladder:

No gallstones or wall thickening visualized. No sonographic Murphy
sign noted by sonographer.

Common bile duct:

Diameter: 2.1 mm

Liver:

There is a nonshadowing echogenic region in the right hepatic lobe
measuring 1.7 x 0.5 x 1.4 cm. This finding is not significantly
changed compared to the previous CT scan and considered benign. No
other abnormalities. Portal vein is patent on color Doppler imaging
with normal direction of blood flow towards the liver.

Other: None.
IMPRESSION: No significant abnormalities identified. No cause for epigastric
pain noted.
